# Patient Record
Sex: Female | Born: 1980
Health system: Southern US, Community
[De-identification: ages and names within clinical notes are randomized; demographics above are authoritative.]

## PROBLEM LIST (undated history)

## (undated) DIAGNOSIS — F909 Attention-deficit hyperactivity disorder, unspecified type: Secondary | ICD-10-CM

## (undated) DIAGNOSIS — F32A Depression, unspecified: Secondary | ICD-10-CM

## (undated) DIAGNOSIS — K219 Gastro-esophageal reflux disease without esophagitis: Secondary | ICD-10-CM

## (undated) DIAGNOSIS — D649 Anemia, unspecified: Secondary | ICD-10-CM

## (undated) DIAGNOSIS — E785 Hyperlipidemia, unspecified: Secondary | ICD-10-CM

## (undated) DIAGNOSIS — R0602 Shortness of breath: Secondary | ICD-10-CM

## (undated) DIAGNOSIS — I10 Essential (primary) hypertension: Secondary | ICD-10-CM

## (undated) DIAGNOSIS — M869 Osteomyelitis, unspecified: Secondary | ICD-10-CM

## (undated) DIAGNOSIS — A4902 Methicillin resistant Staphylococcus aureus infection, unspecified site: Secondary | ICD-10-CM

## (undated) DIAGNOSIS — E119 Type 2 diabetes mellitus without complications: Secondary | ICD-10-CM

## (undated) DIAGNOSIS — Z89422 Acquired absence of other left toe(s): Secondary | ICD-10-CM

## (undated) DIAGNOSIS — I82A12 Acute embolism and thrombosis of left axillary vein: Secondary | ICD-10-CM

## (undated) HISTORY — DX: Acute embolism and thrombosis of left axillary vein: I82.A12

## (undated) HISTORY — DX: Gastro-esophageal reflux disease without esophagitis: K21.9

## (undated) HISTORY — DX: Type 2 diabetes mellitus without complications: E11.9

## (undated) HISTORY — DX: Depression, unspecified: F32.A

## (undated) HISTORY — DX: Essential (primary) hypertension: I10

## (undated) HISTORY — DX: Hyperlipidemia, unspecified: E78.5

## (undated) HISTORY — DX: Methicillin resistant Staphylococcus aureus infection, unspecified site: A49.02

## (undated) HISTORY — DX: Anemia, unspecified: D64.9

## (undated) HISTORY — DX: Shortness of breath: R06.02

## (undated) HISTORY — DX: Acquired absence of other left toe(s): Z89.422

## (undated) HISTORY — DX: Osteomyelitis, unspecified: M86.9

## (undated) HISTORY — DX: Attention-deficit hyperactivity disorder, unspecified type: F90.9

## (undated) HISTORY — PX: AMPUTATION OF REPLICATED TOES: SHX1136

---

## 2017-01-30 DIAGNOSIS — I824Y2 Acute embolism and thrombosis of unspecified deep veins of left proximal lower extremity: Secondary | ICD-10-CM | POA: Insufficient documentation

## 2017-10-01 DIAGNOSIS — E559 Vitamin D deficiency, unspecified: Secondary | ICD-10-CM | POA: Insufficient documentation

## 2017-10-01 DIAGNOSIS — D509 Iron deficiency anemia, unspecified: Secondary | ICD-10-CM | POA: Insufficient documentation

## 2017-11-19 DIAGNOSIS — L0390 Cellulitis, unspecified: Secondary | ICD-10-CM | POA: Insufficient documentation

## 2018-11-19 DIAGNOSIS — K219 Gastro-esophageal reflux disease without esophagitis: Secondary | ICD-10-CM | POA: Diagnosis not present

## 2018-11-19 DIAGNOSIS — I10 Essential (primary) hypertension: Secondary | ICD-10-CM | POA: Diagnosis not present

## 2018-11-19 DIAGNOSIS — R7303 Prediabetes: Secondary | ICD-10-CM | POA: Diagnosis not present

## 2018-11-21 ENCOUNTER — Other Ambulatory Visit: Payer: Self-pay | Admitting: Nurse Practitioner

## 2018-11-21 DIAGNOSIS — R011 Cardiac murmur, unspecified: Secondary | ICD-10-CM

## 2018-11-25 DIAGNOSIS — D649 Anemia, unspecified: Secondary | ICD-10-CM | POA: Diagnosis not present

## 2018-11-29 ENCOUNTER — Other Ambulatory Visit: Payer: 59

## 2018-12-17 ENCOUNTER — Other Ambulatory Visit: Payer: Self-pay

## 2018-12-17 ENCOUNTER — Ambulatory Visit (INDEPENDENT_AMBULATORY_CARE_PROVIDER_SITE_OTHER): Payer: 59

## 2018-12-17 DIAGNOSIS — R011 Cardiac murmur, unspecified: Secondary | ICD-10-CM | POA: Diagnosis not present

## 2018-12-31 ENCOUNTER — Other Ambulatory Visit: Payer: 59

## 2019-05-23 ENCOUNTER — Telehealth: Payer: Self-pay | Admitting: Physician Assistant

## 2019-05-23 NOTE — Telephone Encounter (Signed)
Received a new hem referral from Dr. Quillian Quince for iron deficiency. Chloe Morrow has been cld and scheduled to see [name] on 9/28 at 1:30pm. She's been made aware to arrive 15 minutes early. Letter mailed.

## 2019-05-26 ENCOUNTER — Encounter: Payer: Self-pay | Admitting: Physician Assistant

## 2019-06-02 ENCOUNTER — Encounter: Payer: Self-pay | Admitting: Internal Medicine

## 2019-06-02 ENCOUNTER — Inpatient Hospital Stay: Payer: 59

## 2019-06-02 ENCOUNTER — Inpatient Hospital Stay: Payer: 59 | Attending: Internal Medicine | Admitting: Internal Medicine

## 2019-06-02 ENCOUNTER — Other Ambulatory Visit: Payer: Self-pay

## 2019-06-02 VITALS — BP 160/91 | HR 100 | Temp 98.7°F | Resp 16 | Ht 71.0 in | Wt 269.8 lb

## 2019-06-02 DIAGNOSIS — R0609 Other forms of dyspnea: Secondary | ICD-10-CM | POA: Diagnosis not present

## 2019-06-02 DIAGNOSIS — R069 Unspecified abnormalities of breathing: Secondary | ICD-10-CM

## 2019-06-02 DIAGNOSIS — R5383 Other fatigue: Secondary | ICD-10-CM | POA: Diagnosis not present

## 2019-06-02 DIAGNOSIS — E119 Type 2 diabetes mellitus without complications: Secondary | ICD-10-CM | POA: Insufficient documentation

## 2019-06-02 DIAGNOSIS — I10 Essential (primary) hypertension: Secondary | ICD-10-CM | POA: Insufficient documentation

## 2019-06-02 DIAGNOSIS — Z79899 Other long term (current) drug therapy: Secondary | ICD-10-CM | POA: Insufficient documentation

## 2019-06-02 DIAGNOSIS — R42 Dizziness and giddiness: Secondary | ICD-10-CM | POA: Insufficient documentation

## 2019-06-02 DIAGNOSIS — Z791 Long term (current) use of non-steroidal anti-inflammatories (NSAID): Secondary | ICD-10-CM | POA: Diagnosis not present

## 2019-06-02 DIAGNOSIS — D509 Iron deficiency anemia, unspecified: Secondary | ICD-10-CM | POA: Insufficient documentation

## 2019-06-02 LAB — CBC WITH DIFFERENTIAL (CANCER CENTER ONLY)
Abs Immature Granulocytes: 0.02 10*3/uL (ref 0.00–0.07)
Basophils Absolute: 0.1 10*3/uL (ref 0.0–0.1)
Basophils Relative: 1 %
Eosinophils Absolute: 0.1 10*3/uL (ref 0.0–0.5)
Eosinophils Relative: 1 %
HCT: 31.7 % — ABNORMAL LOW (ref 36.0–46.0)
Hemoglobin: 9.2 g/dL — ABNORMAL LOW (ref 12.0–15.0)
Immature Granulocytes: 0 %
Lymphocytes Relative: 30 %
Lymphs Abs: 1.8 10*3/uL (ref 0.7–4.0)
MCH: 20.7 pg — ABNORMAL LOW (ref 26.0–34.0)
MCHC: 29 g/dL — ABNORMAL LOW (ref 30.0–36.0)
MCV: 71.4 fL — ABNORMAL LOW (ref 80.0–100.0)
Monocytes Absolute: 0.4 10*3/uL (ref 0.1–1.0)
Monocytes Relative: 6 %
Neutro Abs: 3.7 10*3/uL (ref 1.7–7.7)
Neutrophils Relative %: 62 %
Platelet Count: 396 10*3/uL (ref 150–400)
RBC: 4.44 MIL/uL (ref 3.87–5.11)
RDW: 18.7 % — ABNORMAL HIGH (ref 11.5–15.5)
WBC Count: 6.1 10*3/uL (ref 4.0–10.5)
nRBC: 0 % (ref 0.0–0.2)

## 2019-06-02 LAB — CMP (CANCER CENTER ONLY)
ALT: 32 U/L (ref 0–44)
AST: 22 U/L (ref 15–41)
Albumin: 3.7 g/dL (ref 3.5–5.0)
Alkaline Phosphatase: 83 U/L (ref 38–126)
Anion gap: 11 (ref 5–15)
BUN: 11 mg/dL (ref 6–20)
CO2: 22 mmol/L (ref 22–32)
Calcium: 8.7 mg/dL — ABNORMAL LOW (ref 8.9–10.3)
Chloride: 105 mmol/L (ref 98–111)
Creatinine: 0.83 mg/dL (ref 0.44–1.00)
GFR, Est AFR Am: >60 mL/min (ref >60)
GFR, Estimated: >60 mL/min (ref >60)
Glucose, Bld: 159 mg/dL — ABNORMAL HIGH (ref 70–99)
Potassium: 3.6 mmol/L (ref 3.5–5.1)
Sodium: 138 mmol/L (ref 135–145)
Total Bilirubin: 0.3 mg/dL (ref 0.3–1.2)
Total Protein: 7.5 g/dL (ref 6.5–8.1)

## 2019-06-02 LAB — LACTATE DEHYDROGENASE: LDH: 142 U/L (ref 98–192)

## 2019-06-02 LAB — IRON AND TIBC
Iron: 28 ug/dL — ABNORMAL LOW (ref 41–142)
Saturation Ratios: 8 % — ABNORMAL LOW (ref 21–57)
TIBC: 369 ug/dL (ref 236–444)
UIBC: 341 ug/dL (ref 120–384)

## 2019-06-02 LAB — RETICULOCYTES
Immature Retic Fract: 20.8 % — ABNORMAL HIGH (ref 2.3–15.9)
RBC.: 4.41 MIL/uL (ref 3.87–5.11)
Retic Count, Absolute: 95.3 10*3/uL (ref 19.0–186.0)
Retic Ct Pct: 2.2 % (ref 0.4–3.1)

## 2019-06-02 LAB — FERRITIN: Ferritin: <4 ng/mL — ABNORMAL LOW (ref 11–307)
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
## 2019-06-03 ENCOUNTER — Telehealth: Payer: Self-pay | Admitting: Internal Medicine

## 2019-06-03 LAB — HEAVY METALS, BLOOD
Arsenic: 6 ug/L (ref 2–23)
Lead: <1 ug/dL (ref 0–4)
Mercury: <1 ug/L (ref 0.0–14.9)

## 2019-06-03 NOTE — Telephone Encounter (Signed)
Scheduled apt per 9/28 los - pt aware of appt date and time

## 2019-06-06 ENCOUNTER — Other Ambulatory Visit: Payer: Self-pay

## 2019-06-06 ENCOUNTER — Inpatient Hospital Stay: Payer: BC Managed Care – PPO | Attending: Internal Medicine

## 2019-06-06 VITALS — BP 147/86 | HR 78 | Temp 98.8°F | Resp 16

## 2019-06-06 DIAGNOSIS — D509 Iron deficiency anemia, unspecified: Secondary | ICD-10-CM | POA: Diagnosis not present

## 2019-06-06 MED ORDER — SODIUM CHLORIDE 0.9 % IV SOLN
Freq: Once | INTRAVENOUS | Status: AC
  Administered 2019-06-06: 09:00:00 via INTRAVENOUS
  Filled 2019-06-06: qty 250

## 2019-06-06 MED ORDER — ACETAMINOPHEN 325 MG PO TABS
ORAL_TABLET | ORAL | Status: AC
  Filled 2019-06-06: qty 2

## 2019-06-06 MED ORDER — ACETAMINOPHEN 325 MG PO TABS
650.0000 mg | ORAL_TABLET | Freq: Once | ORAL | Status: AC
  Administered 2019-06-06: 650 mg via ORAL

## 2019-06-06 MED ORDER — SODIUM CHLORIDE 0.9 % IV SOLN
200.0000 mg | Freq: Once | INTRAVENOUS | Status: AC
  Administered 2019-06-06: 200 mg via INTRAVENOUS
  Filled 2019-06-06: qty 10

## 2019-06-06 NOTE — Patient Instructions (Signed)

## 2019-06-06 NOTE — Progress Notes (Signed)
Patient started to complain of headache, 6/10, aching immediately after completion of Iron infusion. Patient denied having any other symptoms. Patient's VSS, but her temp did go up to 99.1. Sandi Mealy, PA notified and tylenol ordered. Will continue to monitor patient.  Patient's VSS and temp has decreasedupon completion of 30 minute observation period. Per Sandi Mealy, PA ok for patient to go home.  Patient advised to check temperatures at home and to take tylenol or ibuprofen as needed. Patient advised to seek medical attention if headache worsens or if she develops any other symptoms of a reaction. Educational handout given to patient.

## 2019-06-13 ENCOUNTER — Inpatient Hospital Stay: Payer: BC Managed Care – PPO

## 2019-06-13 ENCOUNTER — Other Ambulatory Visit: Payer: Self-pay

## 2019-06-13 ENCOUNTER — Ambulatory Visit (HOSPITAL_BASED_OUTPATIENT_CLINIC_OR_DEPARTMENT_OTHER): Payer: BC Managed Care – PPO | Admitting: Medical

## 2019-06-13 VITALS — BP 150/85 | HR 79 | Temp 97.4°F | Resp 16

## 2019-06-13 DIAGNOSIS — D509 Iron deficiency anemia, unspecified: Secondary | ICD-10-CM

## 2019-06-13 MED ORDER — ACETAMINOPHEN 325 MG PO TABS
ORAL_TABLET | ORAL | Status: AC
  Filled 2019-06-13: qty 2

## 2019-06-13 MED ORDER — SODIUM CHLORIDE 0.9 % IV SOLN
200.0000 mg | Freq: Once | INTRAVENOUS | Status: AC
  Administered 2019-06-13: 200 mg via INTRAVENOUS
  Filled 2019-06-13: qty 10

## 2019-06-13 MED ORDER — DIPHENHYDRAMINE HCL 25 MG PO CAPS
ORAL_CAPSULE | ORAL | Status: AC
  Filled 2019-06-13: qty 1

## 2019-06-13 MED ORDER — SODIUM CHLORIDE 0.9 % IV SOLN
Freq: Once | INTRAVENOUS | Status: AC
  Administered 2019-06-13: 09:00:00 via INTRAVENOUS
  Filled 2019-06-13: qty 250

## 2019-06-13 MED ORDER — ACETAMINOPHEN 325 MG PO TABS
650.0000 mg | ORAL_TABLET | Freq: Once | ORAL | Status: AC
  Administered 2019-06-13: 650 mg via ORAL

## 2019-06-13 MED ORDER — DIPHENHYDRAMINE HCL 50 MG/ML IJ SOLN: 25.0000 mg | Freq: Once | INTRAMUSCULAR | Status: DC

## 2019-06-13 MED ORDER — DIPHENHYDRAMINE HCL 25 MG PO CAPS
25.0000 mg | ORAL_CAPSULE | Freq: Once | ORAL | Status: AC
  Administered 2019-06-13: 25 mg via ORAL

## 2019-06-13 NOTE — Progress Notes (Signed)
During iron infusion, patient began c/o discomfort at IV site. Infusion immediately stopped. No obvious edema, erythema, or staining noted. Area tender to light palpation. No blood return noted. IV d/c'd, ice pack applied, and site elevated on a pillow. New site obtained and infusion resumed. Sandi Mealy, PA-C evaluated patient in treatment room.

## 2019-06-13 NOTE — Patient Instructions (Signed)

## 2019-06-20 ENCOUNTER — Other Ambulatory Visit: Payer: Self-pay

## 2019-06-20 ENCOUNTER — Inpatient Hospital Stay: Payer: BC Managed Care – PPO

## 2019-06-20 VITALS — BP 155/79 | HR 87 | Temp 99.0°F | Resp 16

## 2019-06-20 DIAGNOSIS — D509 Iron deficiency anemia, unspecified: Secondary | ICD-10-CM | POA: Diagnosis not present

## 2019-06-20 MED ORDER — DIPHENHYDRAMINE HCL 25 MG PO CAPS
ORAL_CAPSULE | ORAL | Status: AC
  Filled 2019-06-20: qty 1

## 2019-06-20 MED ORDER — ACETAMINOPHEN 325 MG PO TABS
650.0000 mg | ORAL_TABLET | Freq: Once | ORAL | Status: AC
  Administered 2019-06-20: 650 mg via ORAL

## 2019-06-20 MED ORDER — SODIUM CHLORIDE 0.9 % IV SOLN
200.0000 mg | Freq: Once | INTRAVENOUS | Status: AC
  Administered 2019-06-20: 09:00:00 200 mg via INTRAVENOUS
  Filled 2019-06-20: qty 10

## 2019-06-20 MED ORDER — ACETAMINOPHEN 325 MG PO TABS
ORAL_TABLET | ORAL | Status: AC
  Filled 2019-06-20: qty 2

## 2019-06-20 MED ORDER — SODIUM CHLORIDE 0.9 % IV SOLN
Freq: Once | INTRAVENOUS | Status: AC
  Administered 2019-06-20: 08:00:00 via INTRAVENOUS
  Filled 2019-06-20: qty 250

## 2019-06-20 MED ORDER — DIPHENHYDRAMINE HCL 25 MG PO CAPS
25.0000 mg | ORAL_CAPSULE | Freq: Once | ORAL | Status: AC
  Administered 2019-06-20: 09:00:00 25 mg via ORAL

## 2019-06-20 NOTE — Patient Instructions (Signed)

## 2019-06-20 NOTE — Progress Notes (Signed)
This patient was seen in infusion as she was receiving IV iron.  She had had possible extravasation of IV iron in her left dorsal finger.  She had no issues of concern.  There was no evidence of hyperpigmentation at the site of the infusion.  She was told to return as needed.  Sandi Mealy, MHS, PA-C Physician Assistant

## 2019-06-27 ENCOUNTER — Inpatient Hospital Stay: Payer: BC Managed Care – PPO

## 2019-06-27 ENCOUNTER — Other Ambulatory Visit: Payer: Self-pay

## 2019-06-27 VITALS — BP 152/92 | HR 88 | Temp 98.2°F | Resp 16

## 2019-06-27 DIAGNOSIS — D509 Iron deficiency anemia, unspecified: Secondary | ICD-10-CM | POA: Diagnosis not present

## 2019-06-27 MED ORDER — DIPHENHYDRAMINE HCL 25 MG PO CAPS
ORAL_CAPSULE | ORAL | Status: AC
  Filled 2019-06-27: qty 1

## 2019-06-27 MED ORDER — SODIUM CHLORIDE 0.9 % IV SOLN
200.0000 mg | Freq: Once | INTRAVENOUS | Status: AC
  Administered 2019-06-27: 09:00:00 200 mg via INTRAVENOUS
  Filled 2019-06-27: qty 10

## 2019-06-27 MED ORDER — DIPHENHYDRAMINE HCL 25 MG PO CAPS
25.0000 mg | ORAL_CAPSULE | Freq: Once | ORAL | Status: AC
  Administered 2019-06-27: 09:00:00 25 mg via ORAL

## 2019-06-27 MED ORDER — SODIUM CHLORIDE 0.9 % IV SOLN
Freq: Once | INTRAVENOUS | Status: AC
  Administered 2019-06-27: 08:00:00 via INTRAVENOUS
  Filled 2019-06-27: qty 250

## 2019-06-27 MED ORDER — ACETAMINOPHEN 325 MG PO TABS
650.0000 mg | ORAL_TABLET | Freq: Once | ORAL | Status: AC
  Administered 2019-06-27: 650 mg via ORAL

## 2019-06-27 MED ORDER — ACETAMINOPHEN 325 MG PO TABS
ORAL_TABLET | ORAL | Status: AC
  Filled 2019-06-27: qty 2

## 2019-06-27 NOTE — Progress Notes (Signed)
Patient c/o intermittent mid-abdominal discomfort. Denies any precipitating factors or other symptoms. Dr. Julien Nordmann notified. Dr. Julien Nordmann advised he feels this complaint likely needs additional work-up with PCP and does not feel this is due to current iron therapy. Patient aware and will f/u with PCP.

## 2019-06-27 NOTE — Patient Instructions (Signed)

## 2019-07-02 DIAGNOSIS — R1013 Epigastric pain: Secondary | ICD-10-CM | POA: Diagnosis not present

## 2019-07-04 DIAGNOSIS — D509 Iron deficiency anemia, unspecified: Secondary | ICD-10-CM | POA: Diagnosis not present

## 2019-07-04 DIAGNOSIS — R140 Abdominal distension (gaseous): Secondary | ICD-10-CM | POA: Diagnosis not present

## 2019-07-04 DIAGNOSIS — R1010 Upper abdominal pain, unspecified: Secondary | ICD-10-CM | POA: Diagnosis not present

## 2019-08-04 ENCOUNTER — Other Ambulatory Visit: Payer: Self-pay

## 2019-08-04 ENCOUNTER — Telehealth: Payer: Self-pay | Admitting: Internal Medicine

## 2019-08-04 ENCOUNTER — Encounter: Payer: Self-pay | Admitting: Internal Medicine

## 2019-08-04 ENCOUNTER — Inpatient Hospital Stay: Payer: BC Managed Care – PPO | Attending: Internal Medicine | Admitting: Internal Medicine

## 2019-08-04 ENCOUNTER — Inpatient Hospital Stay: Payer: BC Managed Care – PPO

## 2019-08-04 VITALS — BP 150/94 | HR 122 | Temp 99.0°F | Resp 20 | Ht 71.0 in | Wt 269.4 lb

## 2019-08-04 DIAGNOSIS — D509 Iron deficiency anemia, unspecified: Secondary | ICD-10-CM

## 2019-08-04 DIAGNOSIS — D500 Iron deficiency anemia secondary to blood loss (chronic): Secondary | ICD-10-CM | POA: Diagnosis not present

## 2019-08-04 DIAGNOSIS — I10 Essential (primary) hypertension: Secondary | ICD-10-CM | POA: Diagnosis not present

## 2019-08-04 DIAGNOSIS — N920 Excessive and frequent menstruation with regular cycle: Secondary | ICD-10-CM | POA: Insufficient documentation

## 2019-08-04 DIAGNOSIS — E119 Type 2 diabetes mellitus without complications: Secondary | ICD-10-CM | POA: Insufficient documentation

## 2019-08-04 DIAGNOSIS — Z791 Long term (current) use of non-steroidal anti-inflammatories (NSAID): Secondary | ICD-10-CM | POA: Insufficient documentation

## 2019-08-04 DIAGNOSIS — Z79899 Other long term (current) drug therapy: Secondary | ICD-10-CM | POA: Insufficient documentation

## 2019-08-04 LAB — CBC WITH DIFFERENTIAL (CANCER CENTER ONLY)
Abs Immature Granulocytes: 0.03 10*3/uL (ref 0.00–0.07)
Basophils Absolute: 0.1 10*3/uL (ref 0.0–0.1)
Basophils Relative: 1 %
Eosinophils Absolute: 0.1 10*3/uL (ref 0.0–0.5)
Eosinophils Relative: 1 %
HCT: 41.1 % (ref 36.0–46.0)
Hemoglobin: 12.9 g/dL (ref 12.0–15.0)
Immature Granulocytes: 0 %
Lymphocytes Relative: 25 %
Lymphs Abs: 1.8 10*3/uL (ref 0.7–4.0)
MCH: 24.5 pg — ABNORMAL LOW (ref 26.0–34.0)
MCHC: 31.4 g/dL (ref 30.0–36.0)
MCV: 78 fL — ABNORMAL LOW (ref 80.0–100.0)
Monocytes Absolute: 0.5 10*3/uL (ref 0.1–1.0)
Monocytes Relative: 7 %
Neutro Abs: 4.6 10*3/uL (ref 1.7–7.7)
Neutrophils Relative %: 66 %
Platelet Count: 360 10*3/uL (ref 150–400)
RBC: 5.27 MIL/uL — ABNORMAL HIGH (ref 3.87–5.11)
RDW: 18.8 % — ABNORMAL HIGH (ref 11.5–15.5)
WBC Count: 7 10*3/uL (ref 4.0–10.5)
nRBC: 0 % (ref 0.0–0.2)

## 2019-08-04 LAB — IRON AND TIBC
Iron: 55 ug/dL (ref 41–142)
Saturation Ratios: 16 % — ABNORMAL LOW (ref 21–57)
TIBC: 344 ug/dL (ref 236–444)
UIBC: 288 ug/dL (ref 120–384)

## 2019-08-04 LAB — FERRITIN: Ferritin: 17 ng/mL (ref 11–307)

## 2019-08-04 NOTE — Telephone Encounter (Signed)
Scheduled per los. Called and left msg. Mailed printout  °

## 2019-08-04 NOTE — Progress Notes (Signed)
[facility] [phone]   [phone]  OFFICE PROGRESS NOTE  Carolee Rota, NP [place] [address]  DIAGNOSIS: persistent iron deficiency anemia of unclear etiology except for the potential blood loss with menstruation and dietary deficiency.  PRIOR THERAPY: Iron infusion with Venofer weekly x4 weeks.  CURRENT THERAPY: None  INTERVAL HISTORY: Chloe Morrow 38 y.o. female returns to the clinic today for follow-up visit.  The patient is feeling fine today with no concerning complaints except for intermittent abdominal pain with bloating.  She is scheduled to have upper endoscopy by her gastroenterologist on August 25, 2019.  She denied having any chest pain, shortness of breath, cough or hemoptysis.  She denied having any fever or chills.  She has intermittent nausea with no vomiting, but has few episodes of diarrhea with no constipation.  She is here today for evaluation and repeat CBC and iron study.  MEDICAL HISTORY: Past Medical History:  Diagnosis Date  . Diabetes mellitus without complication ([facility])   . History of complete ray amputation of fifth toe of left foot ([facility])   . Hypertension   . Osteomyelitis (HCC)     ALLERGIES:  is allergic to penicillins and sulfa antibiotics.  MEDICATIONS:  Current Outpatient Medications  Medication Sig Dispense Refill  . amLODipine (NORVASC) 5 MG tablet TAKE 1 TABLET(5 MG) BY MOUTH DAILY    . diclofenac (VOLTAREN) 75 MG EC tablet Take 75 mg by mouth 2 (two) times daily.    Marland Kitchen glyBURIDE-metformin (GLUCOVANCE) 5-500 MG tablet Take by mouth.    Marland Kitchen lisinopril-hydrochlorothiazide (ZESTORETIC) 20-12.5 MG tablet Take 1 tablet by mouth 2 (two) times daily.     Marland Kitchen omeprazole (PRILOSEC) 20 MG capsule TAKE 1 CAPSULE(20 MG) BY MOUTH DAILY    . traZODone (DESYREL) 50 MG tablet Take 50 mg by mouth at bedtime.     No current facility-administered medications for this visit.     SURGICAL HISTORY: No past  surgical history on file.  REVIEW OF SYSTEMS:  A comprehensive review of systems was negative except for: Constitutional: positive for fatigue Gastrointestinal: positive for abdominal pain, diarrhea and nausea   PHYSICAL EXAMINATION: General appearance: alert, cooperative, fatigued and no distress Head: Normocephalic, without obvious abnormality, atraumatic Neck: no adenopathy, no JVD, supple, symmetrical, trachea midline and thyroid not enlarged, symmetric, no tenderness/mass/nodules Lymph nodes: Cervical, supraclavicular, and axillary nodes normal. Resp: clear to auscultation bilaterally Back: symmetric, no curvature. ROM normal. No CVA tenderness. Cardio: regular rate and rhythm, S1, S2 normal, no murmur, click, rub or gallop GI: soft, non-tender; bowel sounds normal; no masses,  no organomegaly Extremities: extremities normal, atraumatic, no cyanosis or edema  ECOG PERFORMANCE STATUS: 1 - Symptomatic but completely ambulatory  Blood pressure (!) 150/94, pulse (!) 122, temperature 99 F (37.2 C), temperature source Oral, resp. rate 20, height 5\' 11"  (1.803 m), weight 269 lb 6.4 oz (122.2 kg), SpO2 99 %.  LABORATORY DATA: Lab Results  Component Value Date   WBC 7.0 08/04/2019   HGB 12.9 08/04/2019   HCT 41.1 08/04/2019   MCV 78.0 (L) 08/04/2019   PLT 360 08/04/2019      Chemistry      Component Value Date/Time   NA 138 06/02/2019 1441   K 3.6 06/02/2019 1441   CL 105 06/02/2019 1441   CO2 22 06/02/2019 1441   BUN 11 06/02/2019 1441   CREATININE 0.83 06/02/2019 1441      Component Value Date/Time  CALCIUM 8.7 (L) 06/02/2019 1441   ALKPHOS 83 06/02/2019 1441   AST 22 06/02/2019 1441   ALT 32 06/02/2019 1441   BILITOT 0.3 06/02/2019 1441       RADIOGRAPHIC STUDIES: No results found.  ASSESSMENT AND PLAN: This is a very pleasant 38 years old white female with persistent iron deficiency anemia secondary to blood loss from menorrhagia and dietary deficiency.  The  patient is status post iron infusion with Venofer weekly for 4 weeks. She had repeat CBC, iron study and ferritin today that showed significant improvement in her anemia as well as the iron study and ferritin. I recommended for the patient to continue on observation with repeat CBC, iron study and ferritin in 2 months. For the nausea, diarrhea and abdominal pain, she is scheduled to see gastroenterology for evaluation and consideration of upper endoscopy.  The patient was also advised about adjusting her diet and taking Gas-X if needed. She was advised to call immediately if she has any concerning symptoms in the interval. The patient voices understanding of current disease status and treatment options and is in agreement with the current care plan.  All questions were answered. The patient knows to call the clinic with any problems, questions or concerns. We can certainly see the patient much sooner if necessary.  I spent 10 minutes counseling the patient face to face. The total time spent in the appointment was 15 minutes.  Disclaimer: This note was dictated with voice recognition software. Similar sounding words can inadvertently be transcribed and may not be corrected upon review.

## 2019-08-05 DIAGNOSIS — Z20828 Contact with and (suspected) exposure to other viral communicable diseases: Secondary | ICD-10-CM | POA: Diagnosis not present

## 2019-08-20 DIAGNOSIS — Z1159 Encounter for screening for other viral diseases: Secondary | ICD-10-CM | POA: Diagnosis not present

## 2019-08-25 DIAGNOSIS — R1010 Upper abdominal pain, unspecified: Secondary | ICD-10-CM | POA: Diagnosis not present

## 2019-08-25 DIAGNOSIS — R140 Abdominal distension (gaseous): Secondary | ICD-10-CM | POA: Diagnosis not present

## 2019-08-25 DIAGNOSIS — D509 Iron deficiency anemia, unspecified: Secondary | ICD-10-CM | POA: Diagnosis not present

## 2019-08-25 DIAGNOSIS — K3189 Other diseases of stomach and duodenum: Secondary | ICD-10-CM | POA: Diagnosis not present

## 2019-10-06 ENCOUNTER — Other Ambulatory Visit: Payer: Self-pay | Admitting: Medical Oncology

## 2019-10-06 ENCOUNTER — Encounter: Payer: Self-pay | Admitting: Internal Medicine

## 2019-10-06 ENCOUNTER — Other Ambulatory Visit: Payer: Self-pay | Admitting: Internal Medicine

## 2019-10-06 ENCOUNTER — Inpatient Hospital Stay: Payer: BC Managed Care – PPO

## 2019-10-06 ENCOUNTER — Other Ambulatory Visit: Payer: Self-pay

## 2019-10-06 ENCOUNTER — Inpatient Hospital Stay: Payer: BC Managed Care – PPO | Attending: Internal Medicine | Admitting: Internal Medicine

## 2019-10-06 ENCOUNTER — Telehealth: Payer: Self-pay | Admitting: Medical Oncology

## 2019-10-06 VITALS — BP 160/90 | HR 100 | Temp 98.0°F | Resp 20 | Ht 71.0 in | Wt 267.0 lb

## 2019-10-06 DIAGNOSIS — Z79899 Other long term (current) drug therapy: Secondary | ICD-10-CM | POA: Diagnosis not present

## 2019-10-06 DIAGNOSIS — E639 Nutritional deficiency, unspecified: Secondary | ICD-10-CM | POA: Diagnosis not present

## 2019-10-06 DIAGNOSIS — E119 Type 2 diabetes mellitus without complications: Secondary | ICD-10-CM | POA: Diagnosis not present

## 2019-10-06 DIAGNOSIS — D509 Iron deficiency anemia, unspecified: Secondary | ICD-10-CM

## 2019-10-06 DIAGNOSIS — Z791 Long term (current) use of non-steroidal anti-inflammatories (NSAID): Secondary | ICD-10-CM | POA: Insufficient documentation

## 2019-10-06 DIAGNOSIS — D500 Iron deficiency anemia secondary to blood loss (chronic): Secondary | ICD-10-CM | POA: Insufficient documentation

## 2019-10-06 DIAGNOSIS — N920 Excessive and frequent menstruation with regular cycle: Secondary | ICD-10-CM | POA: Insufficient documentation

## 2019-10-06 DIAGNOSIS — I10 Essential (primary) hypertension: Secondary | ICD-10-CM

## 2019-10-06 LAB — IRON AND TIBC
Iron: 32 ug/dL — ABNORMAL LOW (ref 41–142)
Saturation Ratios: 9 % — ABNORMAL LOW (ref 21–57)
TIBC: 361 ug/dL (ref 236–444)
UIBC: 329 ug/dL (ref 120–384)

## 2019-10-06 LAB — CBC WITH DIFFERENTIAL (CANCER CENTER ONLY)
Abs Immature Granulocytes: 0.06 10*3/uL (ref 0.00–0.07)
Basophils Absolute: 0.1 10*3/uL (ref 0.0–0.1)
Basophils Relative: 1 %
Eosinophils Absolute: 0.1 10*3/uL (ref 0.0–0.5)
Eosinophils Relative: 1 %
HCT: 41.5 % (ref 36.0–46.0)
Hemoglobin: 13.1 g/dL (ref 12.0–15.0)
Immature Granulocytes: 1 %
Lymphocytes Relative: 22 %
Lymphs Abs: 1.9 10*3/uL (ref 0.7–4.0)
MCH: 25.7 pg — ABNORMAL LOW (ref 26.0–34.0)
MCHC: 31.6 g/dL (ref 30.0–36.0)
MCV: 81.5 fL (ref 80.0–100.0)
Monocytes Absolute: 0.5 10*3/uL (ref 0.1–1.0)
Monocytes Relative: 6 %
Neutro Abs: 5.9 10*3/uL (ref 1.7–7.7)
Neutrophils Relative %: 69 %
Platelet Count: 393 10*3/uL (ref 150–400)
RBC: 5.09 MIL/uL (ref 3.87–5.11)
RDW: 15.6 % — ABNORMAL HIGH (ref 11.5–15.5)
WBC Count: 8.6 10*3/uL (ref 4.0–10.5)
nRBC: 0 % (ref 0.0–0.2)

## 2019-10-06 LAB — FERRITIN: Ferritin: 6 ng/mL — ABNORMAL LOW (ref 11–307)

## 2019-10-06 MED ORDER — CLONIDINE HCL 0.1 MG PO TABS
0.1000 mg | ORAL_TABLET | ORAL | Status: AC
  Administered 2019-10-06: 0.1 mg via ORAL

## 2019-10-06 MED ORDER — CLONIDINE HCL 0.1 MG PO TABS: 0.1000 mg | ORAL_TABLET | Freq: Once | ORAL | Status: DC

## 2019-10-06 MED ORDER — CLONIDINE HCL 0.1 MG PO TABS
ORAL_TABLET | ORAL | Status: AC
  Filled 2019-10-06: qty 1

## 2019-10-06 NOTE — Telephone Encounter (Signed)
-----   Message from Si Gaul, MD sent at 10/06/2019 12:40 PM EST ----- She will need iron infusion again.  I will order to start this Friday and for the next 4 weeks. ----- Message ----- From: Leory Plowman, Lab In [place] Sent: 10/06/2019   9:15 AM EST To: Si Gaul, MD

## 2019-10-06 NOTE — Telephone Encounter (Signed)
Pt notified. Schedule message sent for 4 doses of ferrlicet

## 2019-10-06 NOTE — Addendum Note (Signed)
Addended by: Charma Igo on: 10/06/2019 10:05 AM   Modules accepted: Orders

## 2019-10-06 NOTE — Progress Notes (Signed)
Dr Arbutus Ped ordered clonidine 0.1 mg today for Hypertension.

## 2019-10-06 NOTE — Progress Notes (Signed)
[facility] [phone]   [phone]  OFFICE PROGRESS NOTE  Carolee Rota, NP [place] [address]  DIAGNOSIS: persistent iron deficiency anemia of unclear etiology except for the potential blood loss with menstruation and dietary deficiency.  PRIOR THERAPY: Iron infusion with Venofer weekly x4 weeks.  CURRENT THERAPY: None  INTERVAL HISTORY: Chloe Morrow 39 y.o. female returns to the clinic today for follow-up visit.  The patient is feeling fine today with no concerning complaints but she is very anxious about her visit.  She has a history of hypertension that was diagnosed at age 64 and she has extensive evaluation at that time.  She is currently on blood pressure medication by her primary care physician.  The patient denied having any current chest pain, shortness of breath, cough or hemoptysis.  She denied having any fever or chills.  She has no nausea, vomiting, diarrhea or constipation.  She is here today for evaluation and repeat CBC, iron study and ferritin.  MEDICAL HISTORY: Past Medical History:  Diagnosis Date  . Diabetes mellitus without complication ([facility])   . History of complete ray amputation of fifth toe of left foot ([facility])   . Hypertension   . Osteomyelitis (HCC)     ALLERGIES:  is allergic to penicillins and sulfa antibiotics.  MEDICATIONS:  Current Outpatient Medications  Medication Sig Dispense Refill  . amLODipine (NORVASC) 5 MG tablet TAKE 1 TABLET(5 MG) BY MOUTH DAILY    . diclofenac (VOLTAREN) 75 MG EC tablet Take 75 mg by mouth 2 (two) times daily.    Marland Kitchen dicyclomine (BENTYL) 20 MG tablet Take 20 mg by mouth 4 (four) times daily.    Marland Kitchen glyBURIDE-metformin (GLUCOVANCE) 5-500 MG tablet Take by mouth.    Marland Kitchen lisinopril-hydrochlorothiazide (ZESTORETIC) 20-12.5 MG tablet Take 1 tablet by mouth 2 (two) times daily.     Marland Kitchen omeprazole (PRILOSEC) 20 MG capsule TAKE 1 CAPSULE(20 MG) BY MOUTH DAILY    . ondansetron  (ZOFRAN) 4 MG tablet Take 4 mg by mouth every 6 (six) hours as needed.    . pantoprazole (PROTONIX) 40 MG tablet Take 40 mg by mouth daily.    . traZODone (DESYREL) 50 MG tablet Take 50 mg by mouth at bedtime.     No current facility-administered medications for this visit.    SURGICAL HISTORY: No past surgical history on file.  REVIEW OF SYSTEMS:  A comprehensive review of systems was negative.   PHYSICAL EXAMINATION: General appearance: alert, cooperative and no distress Head: Normocephalic, without obvious abnormality, atraumatic Neck: no adenopathy, no JVD, supple, symmetrical, trachea midline and thyroid not enlarged, symmetric, no tenderness/mass/nodules Lymph nodes: Cervical, supraclavicular, and axillary nodes normal. Resp: clear to auscultation bilaterally Back: symmetric, no curvature. ROM normal. No CVA tenderness. Cardio: regular rate and rhythm, S1, S2 normal, no murmur, click, rub or gallop GI: soft, non-tender; bowel sounds normal; no masses,  no organomegaly Extremities: extremities normal, atraumatic, no cyanosis or edema  ECOG PERFORMANCE STATUS: 1 - Symptomatic but completely ambulatory  Blood pressure (!) 165/79, pulse 100, temperature 98 F (36.7 C), temperature source Temporal, resp. rate 20, height 5\' 11"  (1.803 m), weight 267 lb (121.1 kg), SpO2 100 %.  LABORATORY DATA: Lab Results  Component Value Date   WBC 8.6 10/06/2019   HGB 13.1 10/06/2019   HCT 41.5 10/06/2019   MCV 81.5 10/06/2019   PLT 393 10/06/2019      Chemistry      Component  Value Date/Time   NA 138 06/02/2019 1441   K 3.6 06/02/2019 1441   CL 105 06/02/2019 1441   CO2 22 06/02/2019 1441   BUN 11 06/02/2019 1441   CREATININE 0.83 06/02/2019 1441      Component Value Date/Time   CALCIUM 8.7 (L) 06/02/2019 1441   ALKPHOS 83 06/02/2019 1441   AST 22 06/02/2019 1441   ALT 32 06/02/2019 1441   BILITOT 0.3 06/02/2019 1441       RADIOGRAPHIC STUDIES: No results found.   ASSESSMENT AND PLAN: This is a very pleasant 39 years old white female with persistent iron deficiency anemia secondary to blood loss from menorrhagia and dietary deficiency.  The patient is status post iron infusion with Venofer weekly for 4 weeks. The patient is currently on observation and she is feeling fine. Repeat CBC today showed normal hemoglobin and hematocrit.  Iron study and ferritin are still pending. For hypertension, I will give the patient a dose of clonidine 0.1 mg p.o. x1 today.  She was advised to take her blood pressure medication as prescribed and to monitor it closely at home and also to discuss with her primary care physician adjustment of her medication if needed. I will see the patient back for follow-up visit in 3 months for evaluation with repeat CBC, iron study and ferritin. If the iron studies showed severe iron deficiency, I will arrange for the patient to receive iron infusion. She was advised to call immediately if she has any concerning symptoms in the interval. The patient voices understanding of current disease status and treatment options and is in agreement with the current care plan.  All questions were answered. The patient knows to call the clinic with any problems, questions or concerns. We can certainly see the patient much sooner if necessary.  I spent 10 minutes counseling the patient face to face. The total time spent in the appointment was 15 minutes.  Disclaimer: This note was dictated with voice recognition software. Similar sounding words can inadvertently be transcribed and may not be corrected upon review.

## 2019-10-07 ENCOUNTER — Telehealth: Payer: Self-pay | Admitting: Internal Medicine

## 2019-10-07 NOTE — Telephone Encounter (Signed)
Scheduled appt per 2/1 sch message - pt aware of appt date and time   

## 2019-10-08 ENCOUNTER — Telehealth: Payer: Self-pay | Admitting: Internal Medicine

## 2019-10-08 NOTE — Telephone Encounter (Signed)
Scheduled per los. Called and left msg. Mailed printoutt

## 2019-10-10 ENCOUNTER — Other Ambulatory Visit: Payer: Self-pay

## 2019-10-10 ENCOUNTER — Inpatient Hospital Stay: Payer: BC Managed Care – PPO

## 2019-10-10 VITALS — BP 151/96 | HR 84 | Temp 97.8°F | Resp 18

## 2019-10-10 DIAGNOSIS — Z79899 Other long term (current) drug therapy: Secondary | ICD-10-CM | POA: Diagnosis not present

## 2019-10-10 DIAGNOSIS — E119 Type 2 diabetes mellitus without complications: Secondary | ICD-10-CM | POA: Diagnosis not present

## 2019-10-10 DIAGNOSIS — I10 Essential (primary) hypertension: Secondary | ICD-10-CM | POA: Diagnosis not present

## 2019-10-10 DIAGNOSIS — D500 Iron deficiency anemia secondary to blood loss (chronic): Secondary | ICD-10-CM | POA: Diagnosis not present

## 2019-10-10 DIAGNOSIS — D509 Iron deficiency anemia, unspecified: Secondary | ICD-10-CM

## 2019-10-10 DIAGNOSIS — Z791 Long term (current) use of non-steroidal anti-inflammatories (NSAID): Secondary | ICD-10-CM | POA: Diagnosis not present

## 2019-10-10 DIAGNOSIS — E639 Nutritional deficiency, unspecified: Secondary | ICD-10-CM | POA: Diagnosis not present

## 2019-10-10 DIAGNOSIS — N920 Excessive and frequent menstruation with regular cycle: Secondary | ICD-10-CM | POA: Diagnosis not present

## 2019-10-10 MED ORDER — DIPHENHYDRAMINE HCL 50 MG/ML IJ SOLN
INTRAMUSCULAR | Status: AC
  Filled 2019-10-10: qty 1

## 2019-10-10 MED ORDER — METHYLPREDNISOLONE SODIUM SUCC 125 MG IJ SOLR
INTRAMUSCULAR | Status: AC
  Filled 2019-10-10: qty 2

## 2019-10-10 MED ORDER — SODIUM CHLORIDE 0.9 % IV SOLN
125.0000 mg | Freq: Once | INTRAVENOUS | Status: AC
  Administered 2019-10-10: 09:00:00 125 mg via INTRAVENOUS
  Filled 2019-10-10: qty 10

## 2019-10-10 MED ORDER — SODIUM CHLORIDE 0.9 % IV SOLN
Freq: Once | INTRAVENOUS | Status: AC
  Filled 2019-10-10: qty 250

## 2019-10-10 MED ORDER — FAMOTIDINE IN NACL 20-0.9 MG/50ML-% IV SOLN
INTRAVENOUS | Status: AC
  Filled 2019-10-10: qty 50

## 2019-10-10 MED ORDER — DIPHENHYDRAMINE HCL 50 MG/ML IJ SOLN
50.0000 mg | Freq: Once | INTRAMUSCULAR | Status: AC
  Administered 2019-10-10: 50 mg via INTRAVENOUS

## 2019-10-10 MED ORDER — FAMOTIDINE IN NACL 20-0.9 MG/50ML-% IV SOLN
20.0000 mg | Freq: Once | INTRAVENOUS | Status: AC
  Administered 2019-10-10: 08:00:00 20 mg via INTRAVENOUS

## 2019-10-10 MED ORDER — ACETAMINOPHEN 325 MG PO TABS
650.0000 mg | ORAL_TABLET | Freq: Once | ORAL | Status: AC
  Administered 2019-10-10: 08:00:00 650 mg via ORAL

## 2019-10-10 MED ORDER — ACETAMINOPHEN 325 MG PO TABS
ORAL_TABLET | ORAL | Status: AC
  Filled 2019-10-10: qty 2

## 2019-10-10 MED ORDER — METHYLPREDNISOLONE SODIUM SUCC 125 MG IJ SOLR
125.0000 mg | Freq: Once | INTRAMUSCULAR | Status: AC
  Administered 2019-10-10: 08:00:00 125 mg via INTRAVENOUS

## 2019-10-10 NOTE — Patient Instructions (Signed)

## 2019-10-17 ENCOUNTER — Other Ambulatory Visit: Payer: Self-pay

## 2019-10-17 ENCOUNTER — Inpatient Hospital Stay: Payer: BC Managed Care – PPO

## 2019-10-17 VITALS — BP 171/85 | HR 98 | Temp 97.9°F | Resp 16

## 2019-10-17 DIAGNOSIS — E119 Type 2 diabetes mellitus without complications: Secondary | ICD-10-CM | POA: Diagnosis not present

## 2019-10-17 DIAGNOSIS — Z791 Long term (current) use of non-steroidal anti-inflammatories (NSAID): Secondary | ICD-10-CM | POA: Diagnosis not present

## 2019-10-17 DIAGNOSIS — I10 Essential (primary) hypertension: Secondary | ICD-10-CM | POA: Diagnosis not present

## 2019-10-17 DIAGNOSIS — E639 Nutritional deficiency, unspecified: Secondary | ICD-10-CM | POA: Diagnosis not present

## 2019-10-17 DIAGNOSIS — Z79899 Other long term (current) drug therapy: Secondary | ICD-10-CM | POA: Diagnosis not present

## 2019-10-17 DIAGNOSIS — D500 Iron deficiency anemia secondary to blood loss (chronic): Secondary | ICD-10-CM | POA: Diagnosis not present

## 2019-10-17 DIAGNOSIS — N920 Excessive and frequent menstruation with regular cycle: Secondary | ICD-10-CM | POA: Diagnosis not present

## 2019-10-17 DIAGNOSIS — D509 Iron deficiency anemia, unspecified: Secondary | ICD-10-CM

## 2019-10-17 MED ORDER — FAMOTIDINE IN NACL 20-0.9 MG/50ML-% IV SOLN
20.0000 mg | Freq: Once | INTRAVENOUS | Status: AC
  Administered 2019-10-17: 20 mg via INTRAVENOUS

## 2019-10-17 MED ORDER — SODIUM CHLORIDE 0.9 % IV SOLN
125.0000 mg | Freq: Once | INTRAVENOUS | Status: AC
  Administered 2019-10-17: 125 mg via INTRAVENOUS
  Filled 2019-10-17: qty 10

## 2019-10-17 MED ORDER — METHYLPREDNISOLONE SODIUM SUCC 125 MG IJ SOLR
INTRAMUSCULAR | Status: AC
  Filled 2019-10-17: qty 2

## 2019-10-17 MED ORDER — SODIUM CHLORIDE 0.9 % IV SOLN
Freq: Once | INTRAVENOUS | Status: AC
  Filled 2019-10-17: qty 250

## 2019-10-17 MED ORDER — METHYLPREDNISOLONE SODIUM SUCC 125 MG IJ SOLR
125.0000 mg | Freq: Once | INTRAMUSCULAR | Status: AC
  Administered 2019-10-17: 125 mg via INTRAVENOUS

## 2019-10-17 MED ORDER — FAMOTIDINE IN NACL 20-0.9 MG/50ML-% IV SOLN
INTRAVENOUS | Status: AC
  Filled 2019-10-17: qty 50

## 2019-10-17 MED ORDER — DIPHENHYDRAMINE HCL 50 MG/ML IJ SOLN
50.0000 mg | Freq: Once | INTRAMUSCULAR | Status: AC
  Administered 2019-10-17: 50 mg via INTRAVENOUS

## 2019-10-17 MED ORDER — ACETAMINOPHEN 325 MG PO TABS
ORAL_TABLET | ORAL | Status: AC
  Filled 2019-10-17: qty 2

## 2019-10-17 MED ORDER — ACETAMINOPHEN 325 MG PO TABS
650.0000 mg | ORAL_TABLET | Freq: Once | ORAL | Status: AC
  Administered 2019-10-17: 650 mg via ORAL

## 2019-10-17 MED ORDER — DIPHENHYDRAMINE HCL 50 MG/ML IJ SOLN
INTRAMUSCULAR | Status: AC
  Filled 2019-10-17: qty 1

## 2019-10-17 NOTE — Patient Instructions (Signed)

## 2019-10-24 ENCOUNTER — Other Ambulatory Visit: Payer: Self-pay

## 2019-10-24 ENCOUNTER — Inpatient Hospital Stay: Payer: BC Managed Care – PPO

## 2019-10-24 VITALS — BP 153/85 | HR 80 | Temp 98.7°F | Resp 17

## 2019-10-24 DIAGNOSIS — Z79899 Other long term (current) drug therapy: Secondary | ICD-10-CM | POA: Diagnosis not present

## 2019-10-24 DIAGNOSIS — N920 Excessive and frequent menstruation with regular cycle: Secondary | ICD-10-CM | POA: Diagnosis not present

## 2019-10-24 DIAGNOSIS — E639 Nutritional deficiency, unspecified: Secondary | ICD-10-CM | POA: Diagnosis not present

## 2019-10-24 DIAGNOSIS — I10 Essential (primary) hypertension: Secondary | ICD-10-CM | POA: Diagnosis not present

## 2019-10-24 DIAGNOSIS — E119 Type 2 diabetes mellitus without complications: Secondary | ICD-10-CM | POA: Diagnosis not present

## 2019-10-24 DIAGNOSIS — D509 Iron deficiency anemia, unspecified: Secondary | ICD-10-CM

## 2019-10-24 DIAGNOSIS — D500 Iron deficiency anemia secondary to blood loss (chronic): Secondary | ICD-10-CM | POA: Diagnosis not present

## 2019-10-24 DIAGNOSIS — Z791 Long term (current) use of non-steroidal anti-inflammatories (NSAID): Secondary | ICD-10-CM | POA: Diagnosis not present

## 2019-10-24 MED ORDER — FAMOTIDINE IN NACL 20-0.9 MG/50ML-% IV SOLN
INTRAVENOUS | Status: AC
  Filled 2019-10-24: qty 50

## 2019-10-24 MED ORDER — SODIUM CHLORIDE 0.9 % IV SOLN
Freq: Once | INTRAVENOUS | Status: AC
  Filled 2019-10-24: qty 250

## 2019-10-24 MED ORDER — DIPHENHYDRAMINE HCL 50 MG/ML IJ SOLN
INTRAMUSCULAR | Status: AC
  Filled 2019-10-24: qty 1

## 2019-10-24 MED ORDER — METHYLPREDNISOLONE SODIUM SUCC 125 MG IJ SOLR
INTRAMUSCULAR | Status: AC
  Filled 2019-10-24: qty 2

## 2019-10-24 MED ORDER — SODIUM CHLORIDE 0.9 % IV SOLN
125.0000 mg | Freq: Once | INTRAVENOUS | Status: AC
  Administered 2019-10-24: 125 mg via INTRAVENOUS
  Filled 2019-10-24: qty 10

## 2019-10-24 MED ORDER — ACETAMINOPHEN 325 MG PO TABS
ORAL_TABLET | ORAL | Status: AC
  Filled 2019-10-24: qty 2

## 2019-10-24 MED ORDER — DIPHENHYDRAMINE HCL 50 MG/ML IJ SOLN
50.0000 mg | Freq: Once | INTRAMUSCULAR | Status: AC
  Administered 2019-10-24: 50 mg via INTRAVENOUS

## 2019-10-24 MED ORDER — ACETAMINOPHEN 325 MG PO TABS
650.0000 mg | ORAL_TABLET | Freq: Once | ORAL | Status: AC
  Administered 2019-10-24: 650 mg via ORAL

## 2019-10-24 MED ORDER — FAMOTIDINE IN NACL 20-0.9 MG/50ML-% IV SOLN
20.0000 mg | Freq: Once | INTRAVENOUS | Status: AC
  Administered 2019-10-24: 20 mg via INTRAVENOUS

## 2019-10-24 MED ORDER — METHYLPREDNISOLONE SODIUM SUCC 125 MG IJ SOLR
125.0000 mg | Freq: Once | INTRAMUSCULAR | Status: AC
  Administered 2019-10-24: 125 mg via INTRAVENOUS

## 2019-10-24 NOTE — Patient Instructions (Signed)
Sodium Ferric Gluconate Complex injection What is this medicine? SODIUM FERRIC GLUCONATE COMPLEX (SOE dee um FER ik GLOO koe nate KOM pleks) is an iron replacement. It is used with epoetin therapy to treat low iron levels in patients who are receiving hemodialysis. This medicine may be used for other purposes; ask your health care provider or pharmacist if you have questions. COMMON BRAND NAME(S): Ferrlecit, Nulecit What should I tell my health care provider before I take this medicine? They need to know if you have any of the following conditions:  anemia that is not from iron deficiency  high levels of iron in the body  an unusual or allergic reaction to iron, benzyl alcohol, other medicines, foods, dyes, or preservatives  pregnant or are trying to become pregnant  breast-feeding How should I use this medicine? This medicine is for infusion into a vein. It is given by a health care professional in a hospital or clinic setting. Talk to your pediatrician regarding the use of this medicine in children. While this drug may be prescribed for children as young as 6 years old for selected conditions, precautions do apply. Overdosage: If you think you have taken too much of this medicine contact a poison control center or emergency room at once. NOTE: This medicine is only for you. Do not share this medicine with others. What if I miss a dose? It is important not to miss your dose. Call your doctor or health care professional if you are unable to keep an appointment. What may interact with this medicine? Do not take this medicine with any of the following medications:  deferoxamine  dimercaprol  other iron products This medicine may also interact with the following medications:  chloramphenicol  deferasirox  medicine for blood pressure like enalapril This list may not describe all possible interactions. Give your health care provider a list of all the medicines, herbs,  non-prescription drugs, or dietary supplements you use. Also tell them if you smoke, drink alcohol, or use illegal drugs. Some items may interact with your medicine. What should I watch for while using this medicine? Your condition will be monitored carefully while you are receiving this medicine. Visit your doctor for check-ups as directed. What side effects may I notice from receiving this medicine? Side effects that you should report to your doctor or health care professional as soon as possible:  allergic reactions like skin rash, itching or hives, swelling of the face, lips, or tongue  breathing problems  changes in hearing  changes in vision  chills, flushing, or sweating  fast, irregular heartbeat  feeling faint or lightheaded, falls  fever, flu-like symptoms  high or low blood pressure  pain, tingling, numbness in the hands or feet  severe pain in the chest, back, flanks, or groin  swelling of the ankles, feet, hands  trouble passing urine or change in the amount of urine  unusually weak or tired Side effects that usually do not require medical attention (report to your doctor or health care professional if they continue or are bothersome):  cramps  dark colored stools  diarrhea  headache  nausea, vomiting  stomach upset This list may not describe all possible side effects. Call your doctor for medical advice about side effects. You may report side effects to FDA at 1-800-FDA-1088. Where should I keep my medicine? This drug is given in a hospital or clinic and will not be stored at home. NOTE: This sheet is a summary. It may not cover all   possible information. If you have questions about this medicine, talk to your doctor, pharmacist, or health care provider.  2020 Elsevier/Gold Standard (2008-04-22 15:58:57)  

## 2019-10-31 ENCOUNTER — Inpatient Hospital Stay: Payer: BC Managed Care – PPO

## 2019-10-31 ENCOUNTER — Other Ambulatory Visit: Payer: Self-pay

## 2019-10-31 VITALS — BP 155/89 | HR 83 | Temp 98.0°F | Resp 16

## 2019-10-31 DIAGNOSIS — D509 Iron deficiency anemia, unspecified: Secondary | ICD-10-CM

## 2019-10-31 DIAGNOSIS — N920 Excessive and frequent menstruation with regular cycle: Secondary | ICD-10-CM | POA: Diagnosis not present

## 2019-10-31 DIAGNOSIS — E639 Nutritional deficiency, unspecified: Secondary | ICD-10-CM | POA: Diagnosis not present

## 2019-10-31 DIAGNOSIS — E119 Type 2 diabetes mellitus without complications: Secondary | ICD-10-CM | POA: Diagnosis not present

## 2019-10-31 DIAGNOSIS — D500 Iron deficiency anemia secondary to blood loss (chronic): Secondary | ICD-10-CM | POA: Diagnosis not present

## 2019-10-31 DIAGNOSIS — Z791 Long term (current) use of non-steroidal anti-inflammatories (NSAID): Secondary | ICD-10-CM | POA: Diagnosis not present

## 2019-10-31 DIAGNOSIS — I10 Essential (primary) hypertension: Secondary | ICD-10-CM | POA: Diagnosis not present

## 2019-10-31 DIAGNOSIS — Z79899 Other long term (current) drug therapy: Secondary | ICD-10-CM | POA: Diagnosis not present

## 2019-10-31 MED ORDER — SODIUM CHLORIDE 0.9 % IV SOLN
Freq: Once | INTRAVENOUS | Status: AC
  Filled 2019-10-31: qty 250

## 2019-10-31 MED ORDER — FAMOTIDINE IN NACL 20-0.9 MG/50ML-% IV SOLN
INTRAVENOUS | Status: AC
  Filled 2019-10-31: qty 50

## 2019-10-31 MED ORDER — METHYLPREDNISOLONE SODIUM SUCC 125 MG IJ SOLR
125.0000 mg | Freq: Once | INTRAMUSCULAR | Status: AC
  Administered 2019-10-31: 125 mg via INTRAVENOUS

## 2019-10-31 MED ORDER — ACETAMINOPHEN 325 MG PO TABS
650.0000 mg | ORAL_TABLET | Freq: Once | ORAL | Status: AC
  Administered 2019-10-31: 650 mg via ORAL

## 2019-10-31 MED ORDER — METHYLPREDNISOLONE SODIUM SUCC 125 MG IJ SOLR
INTRAMUSCULAR | Status: AC
  Filled 2019-10-31: qty 2

## 2019-10-31 MED ORDER — SODIUM CHLORIDE 0.9 % IV SOLN
125.0000 mg | Freq: Once | INTRAVENOUS | Status: AC
  Administered 2019-10-31: 125 mg via INTRAVENOUS
  Filled 2019-10-31: qty 10

## 2019-10-31 MED ORDER — FAMOTIDINE IN NACL 20-0.9 MG/50ML-% IV SOLN
20.0000 mg | Freq: Once | INTRAVENOUS | Status: AC
  Administered 2019-10-31: 20 mg via INTRAVENOUS

## 2019-10-31 MED ORDER — DIPHENHYDRAMINE HCL 50 MG/ML IJ SOLN
INTRAMUSCULAR | Status: AC
  Filled 2019-10-31: qty 1

## 2019-10-31 MED ORDER — DIPHENHYDRAMINE HCL 50 MG/ML IJ SOLN
50.0000 mg | Freq: Once | INTRAMUSCULAR | Status: AC
  Administered 2019-10-31: 50 mg via INTRAVENOUS

## 2019-10-31 MED ORDER — ACETAMINOPHEN 325 MG PO TABS
ORAL_TABLET | ORAL | Status: AC
  Filled 2019-10-31: qty 2

## 2019-10-31 NOTE — Patient Instructions (Signed)
Sodium Ferric Gluconate Complex injection What is this medicine? SODIUM FERRIC GLUCONATE COMPLEX (SOE dee um FER ik GLOO koe nate KOM pleks) is an iron replacement. It is used with epoetin therapy to treat low iron levels in patients who are receiving hemodialysis. This medicine may be used for other purposes; ask your health care provider or pharmacist if you have questions. COMMON BRAND NAME(S): Ferrlecit, Nulecit What should I tell my health care provider before I take this medicine? They need to know if you have any of the following conditions:  anemia that is not from iron deficiency  high levels of iron in the body  an unusual or allergic reaction to iron, benzyl alcohol, other medicines, foods, dyes, or preservatives  pregnant or are trying to become pregnant  breast-feeding How should I use this medicine? This medicine is for infusion into a vein. It is given by a health care professional in a hospital or clinic setting. Talk to your pediatrician regarding the use of this medicine in children. While this drug may be prescribed for children as young as 6 years old for selected conditions, precautions do apply. Overdosage: If you think you have taken too much of this medicine contact a poison control center or emergency room at once. NOTE: This medicine is only for you. Do not share this medicine with others. What if I miss a dose? It is important not to miss your dose. Call your doctor or health care professional if you are unable to keep an appointment. What may interact with this medicine? Do not take this medicine with any of the following medications:  deferoxamine  dimercaprol  other iron products This medicine may also interact with the following medications:  chloramphenicol  deferasirox  medicine for blood pressure like enalapril This list may not describe all possible interactions. Give your health care provider a list of all the medicines, herbs,  non-prescription drugs, or dietary supplements you use. Also tell them if you smoke, drink alcohol, or use illegal drugs. Some items may interact with your medicine. What should I watch for while using this medicine? Your condition will be monitored carefully while you are receiving this medicine. Visit your doctor for check-ups as directed. What side effects may I notice from receiving this medicine? Side effects that you should report to your doctor or health care professional as soon as possible:  allergic reactions like skin rash, itching or hives, swelling of the face, lips, or tongue  breathing problems  changes in hearing  changes in vision  chills, flushing, or sweating  fast, irregular heartbeat  feeling faint or lightheaded, falls  fever, flu-like symptoms  high or low blood pressure  pain, tingling, numbness in the hands or feet  severe pain in the chest, back, flanks, or groin  swelling of the ankles, feet, hands  trouble passing urine or change in the amount of urine  unusually weak or tired Side effects that usually do not require medical attention (report to your doctor or health care professional if they continue or are bothersome):  cramps  dark colored stools  diarrhea  headache  nausea, vomiting  stomach upset This list may not describe all possible side effects. Call your doctor for medical advice about side effects. You may report side effects to FDA at 1-800-FDA-1088. Where should I keep my medicine? This drug is given in a hospital or clinic and will not be stored at home. NOTE: This sheet is a summary. It may not cover all   possible information. If you have questions about this medicine, talk to your doctor, pharmacist, or health care provider.  2020 Elsevier/Gold Standard (2008-04-22 15:58:57)  

## 2019-10-31 NOTE — Progress Notes (Signed)
Pt declined to stay for 30 minute post nulecit observation. 

## 2019-11-18 ENCOUNTER — Other Ambulatory Visit: Payer: Self-pay | Admitting: Nurse Practitioner

## 2019-11-18 DIAGNOSIS — K219 Gastro-esophageal reflux disease without esophagitis: Secondary | ICD-10-CM | POA: Diagnosis not present

## 2019-11-18 DIAGNOSIS — I10 Essential (primary) hypertension: Secondary | ICD-10-CM | POA: Diagnosis not present

## 2019-11-18 DIAGNOSIS — D509 Iron deficiency anemia, unspecified: Secondary | ICD-10-CM | POA: Diagnosis not present

## 2019-11-18 DIAGNOSIS — E119 Type 2 diabetes mellitus without complications: Secondary | ICD-10-CM | POA: Diagnosis not present

## 2019-11-18 DIAGNOSIS — N6452 Nipple discharge: Secondary | ICD-10-CM

## 2019-12-04 ENCOUNTER — Ambulatory Visit
Admission: RE | Admit: 2019-12-04 | Discharge: 2019-12-04 | Disposition: A | Discharge status: Home / Self Care | Payer: BC Managed Care – PPO | Source: Ambulatory Visit | Attending: Nurse Practitioner | Admitting: Nurse Practitioner

## 2019-12-04 ENCOUNTER — Other Ambulatory Visit: Payer: Self-pay | Admitting: Nurse Practitioner

## 2019-12-04 ENCOUNTER — Other Ambulatory Visit: Payer: Self-pay

## 2019-12-04 DIAGNOSIS — N6452 Nipple discharge: Secondary | ICD-10-CM

## 2019-12-04 DIAGNOSIS — N6320 Unspecified lump in the left breast, unspecified quadrant: Secondary | ICD-10-CM

## 2019-12-04 DIAGNOSIS — R922 Inconclusive mammogram: Secondary | ICD-10-CM | POA: Diagnosis not present

## 2019-12-12 ENCOUNTER — Ambulatory Visit
Admission: RE | Admit: 2019-12-12 | Discharge: 2019-12-12 | Disposition: A | Discharge status: Home / Self Care | Payer: BC Managed Care – PPO | Source: Ambulatory Visit | Attending: Nurse Practitioner | Admitting: Nurse Practitioner

## 2019-12-12 ENCOUNTER — Other Ambulatory Visit: Payer: Self-pay

## 2019-12-12 DIAGNOSIS — N6082 Other benign mammary dysplasias of left breast: Secondary | ICD-10-CM | POA: Diagnosis not present

## 2019-12-12 DIAGNOSIS — N6452 Nipple discharge: Secondary | ICD-10-CM

## 2019-12-12 DIAGNOSIS — N6323 Unspecified lump in the left breast, lower outer quadrant: Secondary | ICD-10-CM | POA: Diagnosis not present

## 2019-12-12 DIAGNOSIS — N6320 Unspecified lump in the left breast, unspecified quadrant: Secondary | ICD-10-CM

## 2019-12-23 DIAGNOSIS — N6452 Nipple discharge: Secondary | ICD-10-CM | POA: Diagnosis not present

## 2020-01-05 ENCOUNTER — Inpatient Hospital Stay: Payer: BC Managed Care – PPO

## 2020-01-05 ENCOUNTER — Inpatient Hospital Stay: Payer: BC Managed Care – PPO | Attending: Internal Medicine | Admitting: Internal Medicine

## 2020-01-05 ENCOUNTER — Telehealth: Payer: Self-pay | Admitting: Internal Medicine

## 2020-01-05 ENCOUNTER — Other Ambulatory Visit: Payer: Self-pay

## 2020-01-05 ENCOUNTER — Encounter: Payer: Self-pay | Admitting: Internal Medicine

## 2020-01-05 VITALS — BP 139/89 | HR 102 | Temp 98.5°F | Resp 18 | Ht 71.0 in | Wt 264.9 lb

## 2020-01-05 DIAGNOSIS — D508 Other iron deficiency anemias: Secondary | ICD-10-CM | POA: Diagnosis not present

## 2020-01-05 DIAGNOSIS — Z79899 Other long term (current) drug therapy: Secondary | ICD-10-CM | POA: Diagnosis not present

## 2020-01-05 DIAGNOSIS — D500 Iron deficiency anemia secondary to blood loss (chronic): Secondary | ICD-10-CM | POA: Diagnosis not present

## 2020-01-05 DIAGNOSIS — N920 Excessive and frequent menstruation with regular cycle: Secondary | ICD-10-CM | POA: Insufficient documentation

## 2020-01-05 DIAGNOSIS — E119 Type 2 diabetes mellitus without complications: Secondary | ICD-10-CM | POA: Insufficient documentation

## 2020-01-05 DIAGNOSIS — I10 Essential (primary) hypertension: Secondary | ICD-10-CM | POA: Diagnosis not present

## 2020-01-05 DIAGNOSIS — E639 Nutritional deficiency, unspecified: Secondary | ICD-10-CM | POA: Diagnosis not present

## 2020-01-05 DIAGNOSIS — D509 Iron deficiency anemia, unspecified: Secondary | ICD-10-CM

## 2020-01-05 LAB — CBC WITH DIFFERENTIAL (CANCER CENTER ONLY)
Abs Immature Granulocytes: 0.04 10*3/uL (ref 0.00–0.07)
Basophils Absolute: 0.1 10*3/uL (ref 0.0–0.1)
Basophils Relative: 1 %
Eosinophils Absolute: 0.1 10*3/uL (ref 0.0–0.5)
Eosinophils Relative: 1 %
HCT: 41.6 % (ref 36.0–46.0)
Hemoglobin: 13.7 g/dL (ref 12.0–15.0)
Immature Granulocytes: 1 %
Lymphocytes Relative: 22 %
Lymphs Abs: 1.5 10*3/uL (ref 0.7–4.0)
MCH: 27.6 pg (ref 26.0–34.0)
MCHC: 32.9 g/dL (ref 30.0–36.0)
MCV: 83.7 fL (ref 80.0–100.0)
Monocytes Absolute: 0.5 10*3/uL (ref 0.1–1.0)
Monocytes Relative: 8 %
Neutro Abs: 4.5 10*3/uL (ref 1.7–7.7)
Neutrophils Relative %: 67 %
Platelet Count: 301 10*3/uL (ref 150–400)
RBC: 4.97 MIL/uL (ref 3.87–5.11)
RDW: 14.3 % (ref 11.5–15.5)
WBC Count: 6.6 10*3/uL (ref 4.0–10.5)
nRBC: 0 % (ref 0.0–0.2)

## 2020-01-05 LAB — FERRITIN: Ferritin: 44 ng/mL (ref 11–307)

## 2020-01-05 LAB — IRON AND TIBC
Iron: 36 ug/dL — ABNORMAL LOW (ref 41–142)
Saturation Ratios: 12 % — ABNORMAL LOW (ref 21–57)
TIBC: 308 ug/dL (ref 236–444)
UIBC: 271 ug/dL (ref 120–384)

## 2020-01-05 NOTE — Telephone Encounter (Signed)
Scheduled appt per 5/3 los - pt aware pf appt date and time

## 2020-01-05 NOTE — Progress Notes (Signed)
[facility] [phone]   Fax:([phone]  OFFICE PROGRESS NOTE  Carolee Rota, NP [place] [address]  DIAGNOSIS: persistent iron deficiency anemia of unclear etiology except for the potential blood loss with menstruation and dietary deficiency.  PRIOR THERAPY: Iron infusion with Venofer weekly x4 weeks.  CURRENT THERAPY: None  INTERVAL HISTORY: Chloe Morrow 39 y.o. female returns to the clinic today for follow-up visit.  The patient started feeling more fatigued and tired recently.  She also has craving for ice.  She works as a Investment banker, operational and start feeling lack of stamina at the end of the day.  She denied having any chest pain, shortness of breath, cough or hemoptysis.  She has no weight loss or night sweats.  She has no headache or visual changes.  She is here today for evaluation with repeat CBC, iron study and ferritin.  MEDICAL HISTORY: Past Medical History:  Diagnosis Date  . Diabetes mellitus without complication ([facility])   . History of complete ray amputation of fifth toe of left foot ([facility])   . Hypertension   . Osteomyelitis ([facility])     ALLERGIES:  is allergic to penicillins and sulfa antibiotics.  MEDICATIONS:  Current Outpatient Medications  Medication Sig Dispense Refill  . amLODipine (NORVASC) 5 MG tablet TAKE 1 TABLET(5 MG) BY MOUTH DAILY    . glyBURIDE-metformin (GLUCOVANCE) 5-500 MG tablet Take by mouth.    Marland Kitchen lisinopril-hydrochlorothiazide (ZESTORETIC) 20-12.5 MG tablet Take 1 tablet by mouth 2 (two) times daily.     . traZODone (DESYREL) 50 MG tablet Take 50 mg by mouth at bedtime.    . ondansetron (ZOFRAN) 4 MG tablet Take 4 mg by mouth every 6 (six) hours as needed.    . pantoprazole (PROTONIX) 40 MG tablet Take 40 mg by mouth daily.     No current facility-administered medications for this visit.    SURGICAL HISTORY: No past surgical history on file.  REVIEW OF SYSTEMS:  A comprehensive review of systems  was negative except for: Constitutional: positive for fatigue   PHYSICAL EXAMINATION: General appearance: alert, cooperative and no distress Head: Normocephalic, without obvious abnormality, atraumatic Neck: no adenopathy, no JVD, supple, symmetrical, trachea midline and thyroid not enlarged, symmetric, no tenderness/mass/nodules Lymph nodes: Cervical, supraclavicular, and axillary nodes normal. Resp: clear to auscultation bilaterally Back: symmetric, no curvature. ROM normal. No CVA tenderness. Cardio: regular rate and rhythm, S1, S2 normal, no murmur, click, rub or gallop GI: soft, non-tender; bowel sounds normal; no masses,  no organomegaly Extremities: extremities normal, atraumatic, no cyanosis or edema  ECOG PERFORMANCE STATUS: 1 - Symptomatic but completely ambulatory  Blood pressure 139/89, pulse (!) 102, temperature 98.5 F (36.9 C), temperature source Temporal, resp. rate 18, height _0  (1.803 m), weight 264 lb 14.4 oz (120.2 kg), SpO2 100 %.  LABORATORY DATA: Lab Results  Component Value Date   WBC 6.6 01/05/2020   HGB 13.7 01/05/2020   HCT 41.6 01/05/2020   MCV 83.7 01/05/2020   PLT 301 01/05/2020      Chemistry      Component Value Date/Time   NA 138 06/02/2019 1441   K 3.6 06/02/2019 1441   CL 105 06/02/2019 1441   CO2 22 06/02/2019 1441   BUN 11 06/02/2019 1441   CREATININE 0.83 06/02/2019 1441      Component Value Date/Time   CALCIUM 8.7 (L) 06/02/2019 1441   ALKPHOS 83 06/02/2019 1441   AST 22 06/02/2019  1441   ALT 32 06/02/2019 1441   BILITOT 0.3 06/02/2019 1441       RADIOGRAPHIC STUDIES: MM CLIP PLACEMENT LEFT  Result Date: 12/12/2019 CLINICAL DATA:  Status post ultrasound-guided core biopsy of intraductal mass in the 4 o'clock location of the LEFT breast. EXAM: DIAGNOSTIC LEFT MAMMOGRAM POST ULTRASOUND BIOPSY COMPARISON:  Previous exam(s). FINDINGS: Mammographic images were obtained following ultrasound guided biopsy of intraductal mass in the  4 o'clock location of the LEFT breast and placement of a ribbon shaped clip. The biopsy marking clip is in expected position at the site of biopsy. IMPRESSION: Appropriate positioning of the ribbon shaped shaped biopsy marking clip at the site of biopsy in the LOWER OUTER QUADRANT LEFT breast. Final Assessment: Post Procedure Mammograms for Marker Placement Electronically Signed   By: Nolon Nations M.D.   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 This was found to be concordant by Dr. Nolon Nations. Pathology results were discussed with the patient by telephone. The patient reported doing well after the biopsy with tenderness and bruising at the site. Post biopsy instructions and care were reviewed and questions were answered. The patient was encouraged to call [facility] for any additional concerns. Surgical consultation has been arranged with Dr. Rolm Bookbinder at [facility] on December 23, 2019, with a recommendation for a bilateral breast MRI for further evaluation of spontaneous clear single duct LEFT nipple discharge. Pathology results reported by Terie Purser, RN on 12/15/2019. Electronically Signed   By: Nolon Nations M.D.   On: 12/15/2019 12:00   Result Date: 12/22/2019 CLINICAL DATA:  Patient presents for ultrasound-guided core biopsy of intraductal mass in the 4 o'clock location of the LEFT breast. EXAM: ULTRASOUND GUIDED LEFT BREAST CORE NEEDLE BIOPSY COMPARISON:  Previous exam(s). PROCEDURE: I met with the patient and we discussed the procedure of ultrasound-guided biopsy, including benefits and alternatives. We discussed the high likelihood of a successful procedure. We discussed the risks of the procedure,  including infection, bleeding, tissue injury, clip migration, and inadequate sampling. Informed written consent was given. The usual time-out protocol was performed immediately prior to the procedure. Lesion quadrant: LOWER OUTER QUADRANT LEFT breast Using sterile technique and 1% Lidocaine and 1% lidocaine with epinephrine as local anesthetic, under direct ultrasound visualization, a 12 gauge spring-loaded device was used to perform biopsy of intraductal mass in the 4 o'clock location of the LEFT breast using a inferior to superior approach. At the conclusion of the procedure ribbon shaped tissue marker clip was deployed into the biopsy cavity. Follow up 2 view mammogram was performed and dictated separately. IMPRESSION: Ultrasound guided biopsy of LEFT breast mass. No apparent complications. Electronically Signed: By: Nolon Nations M.D. On: 12/12/2019 08:24    ASSESSMENT AND PLAN: This is a very pleasant 39 years old white female with persistent iron deficiency anemia secondary to blood loss from menorrhagia and dietary deficiency.  The patient is status post iron infusion with Venofer weekly for 4 weeks. The patient tolerated her previous iron infusion fairly well.  She is here today with repeat CBC which showed normal hemoglobin and hematocrit but her serum iron and iron saturation are low.  She is currently on oral iron tablets with Flintstone iron tablet. I  gives the patient the option of iron infusion again and she is interested in proceeding with the iron infusion because of the start of the summer camp soon.  She will be treated with Venofer for the next 4 weeks. I will see her back for follow-up visit in 3 months for evaluation with repeat CBC, iron study and ferritin. The patient voices understanding of current disease status and treatment options and is in agreement with the current care plan.  All questions were answered. The patient knows to call the clinic with any problems, questions or  concerns. We can certainly see the patient much sooner if necessary.  Disclaimer: This note was dictated with voice recognition software. Similar sounding words can inadvertently be transcribed and may not be corrected upon review.

## 2020-01-09 ENCOUNTER — Inpatient Hospital Stay: Payer: BC Managed Care – PPO

## 2020-01-09 ENCOUNTER — Other Ambulatory Visit: Payer: Self-pay

## 2020-01-09 VITALS — BP 150/80 | HR 82 | Temp 98.7°F | Resp 18

## 2020-01-09 DIAGNOSIS — E119 Type 2 diabetes mellitus without complications: Secondary | ICD-10-CM | POA: Diagnosis not present

## 2020-01-09 DIAGNOSIS — D509 Iron deficiency anemia, unspecified: Secondary | ICD-10-CM

## 2020-01-09 DIAGNOSIS — I10 Essential (primary) hypertension: Secondary | ICD-10-CM | POA: Diagnosis not present

## 2020-01-09 DIAGNOSIS — Z79899 Other long term (current) drug therapy: Secondary | ICD-10-CM | POA: Diagnosis not present

## 2020-01-09 DIAGNOSIS — D500 Iron deficiency anemia secondary to blood loss (chronic): Secondary | ICD-10-CM | POA: Diagnosis not present

## 2020-01-09 DIAGNOSIS — E639 Nutritional deficiency, unspecified: Secondary | ICD-10-CM | POA: Diagnosis not present

## 2020-01-09 DIAGNOSIS — N920 Excessive and frequent menstruation with regular cycle: Secondary | ICD-10-CM | POA: Diagnosis not present

## 2020-01-09 MED ORDER — METHYLPREDNISOLONE SODIUM SUCC 125 MG IJ SOLR
INTRAMUSCULAR | Status: AC
  Filled 2020-01-09: qty 2

## 2020-01-09 MED ORDER — SODIUM CHLORIDE 0.9 % IV SOLN
Freq: Once | INTRAVENOUS | Status: AC
  Filled 2020-01-09: qty 250

## 2020-01-09 MED ORDER — ACETAMINOPHEN 325 MG PO TABS
ORAL_TABLET | ORAL | Status: AC
  Filled 2020-01-09: qty 2

## 2020-01-09 MED ORDER — FAMOTIDINE IN NACL 20-0.9 MG/50ML-% IV SOLN
INTRAVENOUS | Status: AC
  Filled 2020-01-09: qty 50

## 2020-01-09 MED ORDER — DIPHENHYDRAMINE HCL 25 MG PO CAPS
50.0000 mg | ORAL_CAPSULE | Freq: Once | ORAL | Status: AC
  Administered 2020-01-09: 50 mg via ORAL

## 2020-01-09 MED ORDER — DIPHENHYDRAMINE HCL 25 MG PO CAPS
ORAL_CAPSULE | ORAL | Status: AC
  Filled 2020-01-09: qty 2

## 2020-01-09 MED ORDER — ACETAMINOPHEN 325 MG PO TABS
650.0000 mg | ORAL_TABLET | Freq: Once | ORAL | Status: AC
  Administered 2020-01-09: 650 mg via ORAL

## 2020-01-09 MED ORDER — FAMOTIDINE IN NACL 20-0.9 MG/50ML-% IV SOLN
20.0000 mg | Freq: Once | INTRAVENOUS | Status: AC
  Administered 2020-01-09: 20 mg via INTRAVENOUS

## 2020-01-09 MED ORDER — SODIUM CHLORIDE 0.9 % IV SOLN
125.0000 mg | Freq: Once | INTRAVENOUS | Status: AC
  Administered 2020-01-09: 125 mg via INTRAVENOUS
  Filled 2020-01-09: qty 10

## 2020-01-09 MED ORDER — METHYLPREDNISOLONE SODIUM SUCC 125 MG IJ SOLR
125.0000 mg | Freq: Once | INTRAMUSCULAR | Status: AC
  Administered 2020-01-09: 125 mg via INTRAVENOUS

## 2020-01-09 NOTE — Progress Notes (Signed)
Patient declined to stay for her 30 minute post-observation. VSS upon discharge.

## 2020-01-16 ENCOUNTER — Other Ambulatory Visit: Payer: Self-pay

## 2020-01-16 ENCOUNTER — Inpatient Hospital Stay: Payer: BC Managed Care – PPO

## 2020-01-16 VITALS — BP 138/80 | HR 88 | Temp 98.0°F | Resp 16

## 2020-01-16 DIAGNOSIS — N920 Excessive and frequent menstruation with regular cycle: Secondary | ICD-10-CM | POA: Diagnosis not present

## 2020-01-16 DIAGNOSIS — Z79899 Other long term (current) drug therapy: Secondary | ICD-10-CM | POA: Diagnosis not present

## 2020-01-16 DIAGNOSIS — I10 Essential (primary) hypertension: Secondary | ICD-10-CM | POA: Diagnosis not present

## 2020-01-16 DIAGNOSIS — E119 Type 2 diabetes mellitus without complications: Secondary | ICD-10-CM | POA: Diagnosis not present

## 2020-01-16 DIAGNOSIS — E639 Nutritional deficiency, unspecified: Secondary | ICD-10-CM | POA: Diagnosis not present

## 2020-01-16 DIAGNOSIS — D509 Iron deficiency anemia, unspecified: Secondary | ICD-10-CM

## 2020-01-16 DIAGNOSIS — D500 Iron deficiency anemia secondary to blood loss (chronic): Secondary | ICD-10-CM | POA: Diagnosis not present

## 2020-01-16 MED ORDER — DIPHENHYDRAMINE HCL 25 MG PO CAPS
50.0000 mg | ORAL_CAPSULE | Freq: Once | ORAL | Status: AC
  Administered 2020-01-16: 50 mg via ORAL

## 2020-01-16 MED ORDER — ACETAMINOPHEN 325 MG PO TABS
650.0000 mg | ORAL_TABLET | Freq: Once | ORAL | Status: AC
  Administered 2020-01-16: 650 mg via ORAL

## 2020-01-16 MED ORDER — DIPHENHYDRAMINE HCL 25 MG PO CAPS
ORAL_CAPSULE | ORAL | Status: AC
  Filled 2020-01-16: qty 2

## 2020-01-16 MED ORDER — FAMOTIDINE IN NACL 20-0.9 MG/50ML-% IV SOLN
20.0000 mg | Freq: Once | INTRAVENOUS | Status: AC
  Administered 2020-01-16: 20 mg via INTRAVENOUS

## 2020-01-16 MED ORDER — ACETAMINOPHEN 325 MG PO TABS
ORAL_TABLET | ORAL | Status: AC
  Filled 2020-01-16: qty 2

## 2020-01-16 MED ORDER — SODIUM CHLORIDE 0.9 % IV SOLN
125.0000 mg | Freq: Once | INTRAVENOUS | Status: AC
  Administered 2020-01-16: 125 mg via INTRAVENOUS
  Filled 2020-01-16: qty 10

## 2020-01-16 MED ORDER — SODIUM CHLORIDE 0.9 % IV SOLN
Freq: Once | INTRAVENOUS | Status: AC
  Filled 2020-01-16: qty 250

## 2020-01-16 MED ORDER — METHYLPREDNISOLONE SODIUM SUCC 125 MG IJ SOLR
125.0000 mg | Freq: Once | INTRAMUSCULAR | Status: AC
  Administered 2020-01-16: 125 mg via INTRAVENOUS

## 2020-01-16 MED ORDER — METHYLPREDNISOLONE SODIUM SUCC 125 MG IJ SOLR
INTRAMUSCULAR | Status: AC
  Filled 2020-01-16: qty 2

## 2020-01-16 MED ORDER — FAMOTIDINE IN NACL 20-0.9 MG/50ML-% IV SOLN
INTRAVENOUS | Status: AC
  Filled 2020-01-16: qty 50

## 2020-01-16 NOTE — Patient Instructions (Signed)
Sodium Ferric Gluconate Complex injection What is this medicine? SODIUM FERRIC GLUCONATE COMPLEX (SOE dee um FER ik GLOO koe nate KOM pleks) is an iron replacement. It is used with epoetin therapy to treat low iron levels in patients who are receiving hemodialysis. This medicine may be used for other purposes; ask your health care provider or pharmacist if you have questions. COMMON BRAND NAME(S): Ferrlecit, Nulecit What should I tell my health care provider before I take this medicine? They need to know if you have any of the following conditions:  anemia that is not from iron deficiency  high levels of iron in the body  an unusual or allergic reaction to iron, benzyl alcohol, other medicines, foods, dyes, or preservatives  pregnant or are trying to become pregnant  breast-feeding How should I use this medicine? This medicine is for infusion into a vein. It is given by a health care professional in a hospital or clinic setting. Talk to your pediatrician regarding the use of this medicine in children. While this drug may be prescribed for children as young as 6 years old for selected conditions, precautions do apply. Overdosage: If you think you have taken too much of this medicine contact a poison control center or emergency room at once. NOTE: This medicine is only for you. Do not share this medicine with others. What if I miss a dose? It is important not to miss your dose. Call your doctor or health care professional if you are unable to keep an appointment. What may interact with this medicine? Do not take this medicine with any of the following medications:  deferoxamine  dimercaprol  other iron products This medicine may also interact with the following medications:  chloramphenicol  deferasirox  medicine for blood pressure like enalapril This list may not describe all possible interactions. Give your health care provider a list of all the medicines, herbs,  non-prescription drugs, or dietary supplements you use. Also tell them if you smoke, drink alcohol, or use illegal drugs. Some items may interact with your medicine. What should I watch for while using this medicine? Your condition will be monitored carefully while you are receiving this medicine. Visit your doctor for check-ups as directed. What side effects may I notice from receiving this medicine? Side effects that you should report to your doctor or health care professional as soon as possible:  allergic reactions like skin rash, itching or hives, swelling of the face, lips, or tongue  breathing problems  changes in hearing  changes in vision  chills, flushing, or sweating  fast, irregular heartbeat  feeling faint or lightheaded, falls  fever, flu-like symptoms  high or low blood pressure  pain, tingling, numbness in the hands or feet  severe pain in the chest, back, flanks, or groin  swelling of the ankles, feet, hands  trouble passing urine or change in the amount of urine  unusually weak or tired Side effects that usually do not require medical attention (report to your doctor or health care professional if they continue or are bothersome):  cramps  dark colored stools  diarrhea  headache  nausea, vomiting  stomach upset This list may not describe all possible side effects. Call your doctor for medical advice about side effects. You may report side effects to FDA at 1-800-FDA-1088. Where should I keep my medicine? This drug is given in a hospital or clinic and will not be stored at home. NOTE: This sheet is a summary. It may not cover all   possible information. If you have questions about this medicine, talk to your doctor, pharmacist, or health care provider.  2020 Elsevier/Gold Standard (2008-04-22 15:58:57)  

## 2020-01-23 ENCOUNTER — Other Ambulatory Visit: Payer: Self-pay

## 2020-01-23 ENCOUNTER — Inpatient Hospital Stay: Payer: BC Managed Care – PPO

## 2020-01-23 VITALS — BP 115/85 | HR 82 | Temp 98.4°F | Resp 16

## 2020-01-23 DIAGNOSIS — N920 Excessive and frequent menstruation with regular cycle: Secondary | ICD-10-CM | POA: Diagnosis not present

## 2020-01-23 DIAGNOSIS — E119 Type 2 diabetes mellitus without complications: Secondary | ICD-10-CM | POA: Diagnosis not present

## 2020-01-23 DIAGNOSIS — D509 Iron deficiency anemia, unspecified: Secondary | ICD-10-CM

## 2020-01-23 DIAGNOSIS — I10 Essential (primary) hypertension: Secondary | ICD-10-CM | POA: Diagnosis not present

## 2020-01-23 DIAGNOSIS — D500 Iron deficiency anemia secondary to blood loss (chronic): Secondary | ICD-10-CM | POA: Diagnosis not present

## 2020-01-23 DIAGNOSIS — Z79899 Other long term (current) drug therapy: Secondary | ICD-10-CM | POA: Diagnosis not present

## 2020-01-23 DIAGNOSIS — E639 Nutritional deficiency, unspecified: Secondary | ICD-10-CM | POA: Diagnosis not present

## 2020-01-23 MED ORDER — DIPHENHYDRAMINE HCL 25 MG PO CAPS
50.0000 mg | ORAL_CAPSULE | Freq: Once | ORAL | Status: AC
  Administered 2020-01-23: 50 mg via ORAL

## 2020-01-23 MED ORDER — FAMOTIDINE IN NACL 20-0.9 MG/50ML-% IV SOLN
INTRAVENOUS | Status: AC
  Filled 2020-01-23: qty 50

## 2020-01-23 MED ORDER — DIPHENHYDRAMINE HCL 25 MG PO CAPS
ORAL_CAPSULE | ORAL | Status: AC
  Filled 2020-01-23: qty 2

## 2020-01-23 MED ORDER — SODIUM CHLORIDE 0.9 % IV SOLN
125.0000 mg | Freq: Once | INTRAVENOUS | Status: AC
  Administered 2020-01-23: 125 mg via INTRAVENOUS
  Filled 2020-01-23: qty 10

## 2020-01-23 MED ORDER — FAMOTIDINE IN NACL 20-0.9 MG/50ML-% IV SOLN
20.0000 mg | Freq: Once | INTRAVENOUS | Status: AC
  Administered 2020-01-23: 20 mg via INTRAVENOUS

## 2020-01-23 MED ORDER — ACETAMINOPHEN 325 MG PO TABS
ORAL_TABLET | ORAL | Status: AC
  Filled 2020-01-23: qty 2

## 2020-01-23 MED ORDER — ACETAMINOPHEN 325 MG PO TABS
650.0000 mg | ORAL_TABLET | Freq: Once | ORAL | Status: AC
  Administered 2020-01-23: 650 mg via ORAL

## 2020-01-23 MED ORDER — SODIUM CHLORIDE 0.9 % IV SOLN
Freq: Once | INTRAVENOUS | Status: AC
  Filled 2020-01-23: qty 250

## 2020-01-23 MED ORDER — METHYLPREDNISOLONE SODIUM SUCC 125 MG IJ SOLR
INTRAMUSCULAR | Status: AC
  Filled 2020-01-23: qty 2

## 2020-01-23 MED ORDER — METHYLPREDNISOLONE SODIUM SUCC 125 MG IJ SOLR
125.0000 mg | Freq: Once | INTRAMUSCULAR | Status: AC
  Administered 2020-01-23: 125 mg via INTRAVENOUS

## 2020-01-23 NOTE — Patient Instructions (Signed)
Sodium Ferric Gluconate Complex injection What is this medicine? SODIUM FERRIC GLUCONATE COMPLEX (SOE dee um FER ik GLOO koe nate KOM pleks) is an iron replacement. It is used with epoetin therapy to treat low iron levels in patients who are receiving hemodialysis. This medicine may be used for other purposes; ask your health care provider or pharmacist if you have questions. COMMON BRAND NAME(S): Ferrlecit, Nulecit What should I tell my health care provider before I take this medicine? They need to know if you have any of the following conditions:  anemia that is not from iron deficiency  high levels of iron in the body  an unusual or allergic reaction to iron, benzyl alcohol, other medicines, foods, dyes, or preservatives  pregnant or are trying to become pregnant  breast-feeding How should I use this medicine? This medicine is for infusion into a vein. It is given by a health care professional in a hospital or clinic setting. Talk to your pediatrician regarding the use of this medicine in children. While this drug may be prescribed for children as young as 6 years old for selected conditions, precautions do apply. Overdosage: If you think you have taken too much of this medicine contact a poison control center or emergency room at once. NOTE: This medicine is only for you. Do not share this medicine with others. What if I miss a dose? It is important not to miss your dose. Call your doctor or health care professional if you are unable to keep an appointment. What may interact with this medicine? Do not take this medicine with any of the following medications:  deferoxamine  dimercaprol  other iron products This medicine may also interact with the following medications:  chloramphenicol  deferasirox  medicine for blood pressure like enalapril This list may not describe all possible interactions. Give your health care provider a list of all the medicines, herbs,  non-prescription drugs, or dietary supplements you use. Also tell them if you smoke, drink alcohol, or use illegal drugs. Some items may interact with your medicine. What should I watch for while using this medicine? Your condition will be monitored carefully while you are receiving this medicine. Visit your doctor for check-ups as directed. What side effects may I notice from receiving this medicine? Side effects that you should report to your doctor or health care professional as soon as possible:  allergic reactions like skin rash, itching or hives, swelling of the face, lips, or tongue  breathing problems  changes in hearing  changes in vision  chills, flushing, or sweating  fast, irregular heartbeat  feeling faint or lightheaded, falls  fever, flu-like symptoms  high or low blood pressure  pain, tingling, numbness in the hands or feet  severe pain in the chest, back, flanks, or groin  swelling of the ankles, feet, hands  trouble passing urine or change in the amount of urine  unusually weak or tired Side effects that usually do not require medical attention (report to your doctor or health care professional if they continue or are bothersome):  cramps  dark colored stools  diarrhea  headache  nausea, vomiting  stomach upset This list may not describe all possible side effects. Call your doctor for medical advice about side effects. You may report side effects to FDA at 1-800-FDA-1088. Where should I keep my medicine? This drug is given in a hospital or clinic and will not be stored at home. NOTE: This sheet is a summary. It may not cover all   possible information. If you have questions about this medicine, talk to your doctor, pharmacist, or health care provider.  2020 Elsevier/Gold Standard (2008-04-22 15:58:57)  

## 2020-01-30 ENCOUNTER — Inpatient Hospital Stay: Payer: BC Managed Care – PPO

## 2020-01-30 ENCOUNTER — Other Ambulatory Visit: Payer: Self-pay

## 2020-01-30 VITALS — BP 156/95 | HR 91 | Temp 99.4°F | Resp 17

## 2020-01-30 DIAGNOSIS — D509 Iron deficiency anemia, unspecified: Secondary | ICD-10-CM

## 2020-01-30 DIAGNOSIS — E639 Nutritional deficiency, unspecified: Secondary | ICD-10-CM | POA: Diagnosis not present

## 2020-01-30 DIAGNOSIS — Z79899 Other long term (current) drug therapy: Secondary | ICD-10-CM | POA: Diagnosis not present

## 2020-01-30 DIAGNOSIS — E119 Type 2 diabetes mellitus without complications: Secondary | ICD-10-CM | POA: Diagnosis not present

## 2020-01-30 DIAGNOSIS — I10 Essential (primary) hypertension: Secondary | ICD-10-CM | POA: Diagnosis not present

## 2020-01-30 DIAGNOSIS — N920 Excessive and frequent menstruation with regular cycle: Secondary | ICD-10-CM | POA: Diagnosis not present

## 2020-01-30 DIAGNOSIS — D500 Iron deficiency anemia secondary to blood loss (chronic): Secondary | ICD-10-CM | POA: Diagnosis not present

## 2020-01-30 MED ORDER — METHYLPREDNISOLONE SODIUM SUCC 125 MG IJ SOLR
INTRAMUSCULAR | Status: AC
  Filled 2020-01-30: qty 2

## 2020-01-30 MED ORDER — ACETAMINOPHEN 325 MG PO TABS
ORAL_TABLET | ORAL | Status: AC
  Filled 2020-01-30: qty 2

## 2020-01-30 MED ORDER — METHYLPREDNISOLONE SODIUM SUCC 125 MG IJ SOLR
125.0000 mg | Freq: Once | INTRAMUSCULAR | Status: AC
  Administered 2020-01-30: 125 mg via INTRAVENOUS

## 2020-01-30 MED ORDER — FAMOTIDINE IN NACL 20-0.9 MG/50ML-% IV SOLN
20.0000 mg | Freq: Once | INTRAVENOUS | Status: AC
  Administered 2020-01-30: 20 mg via INTRAVENOUS

## 2020-01-30 MED ORDER — SODIUM CHLORIDE 0.9 % IV SOLN
125.0000 mg | Freq: Once | INTRAVENOUS | Status: AC
  Administered 2020-01-30: 125 mg via INTRAVENOUS
  Filled 2020-01-30: qty 10

## 2020-01-30 MED ORDER — SODIUM CHLORIDE 0.9 % IV SOLN
Freq: Once | INTRAVENOUS | Status: AC
  Filled 2020-01-30: qty 250

## 2020-01-30 MED ORDER — ACETAMINOPHEN 325 MG PO TABS
650.0000 mg | ORAL_TABLET | Freq: Once | ORAL | Status: AC
  Administered 2020-01-30: 650 mg via ORAL

## 2020-01-30 MED ORDER — DIPHENHYDRAMINE HCL 25 MG PO CAPS
ORAL_CAPSULE | ORAL | Status: AC
  Filled 2020-01-30: qty 2

## 2020-01-30 MED ORDER — FAMOTIDINE IN NACL 20-0.9 MG/50ML-% IV SOLN
INTRAVENOUS | Status: AC
  Filled 2020-01-30: qty 50

## 2020-01-30 MED ORDER — DIPHENHYDRAMINE HCL 25 MG PO CAPS
50.0000 mg | ORAL_CAPSULE | Freq: Once | ORAL | Status: AC
  Administered 2020-01-30: 50 mg via ORAL

## 2020-04-05 NOTE — Progress Notes (Signed)
[facility] OFFICE PROGRESS NOTE  [name], NP [address] [place] [address]  DIAGNOSIS: Persistent iron deficiency anemia of unclear etiology except for the potential blood loss with menstruation and dietary deficiency.  PRIOR THERAPY: Iron infusion with Venofer weekly x4 weeks PRN. Last dose 01/30/20  CURRENT THERAPY: Oral iron supplements  INTERVAL HISTORY: Chloe Morrow 39 y.o. female returns to the clinic today for a follow-up visit.  The patient is feeling fairly well today without any concerning complaints. The patient receives IV iron infusions with Venofer as needed for iron deficiency.  The last infusion of Venofer was on 01/30/2020.  The patient takes a Flintstone oral multivitamin with iron because she is able to tolerate it. She is compliant with this. She had nausea/vomiting with other iron supplements in the past. Her anemia is thought to be secondary to dietary deficiency and blood loss. She had a endoscopy and colonoscopy in December 2020 to evaluate for GI blood loss or celiacs disease. Her procedure was unremarkable for an etiology of her anemia. Of note, the patient does take Protonix. Today, the patient reports fatigue. She is a residential Printmaker and notes that when she becomes iron deficient that she feels short of breath with exertion as well as her fingers/toes get colder.  Her mensual periods are every 4 weeks and lasts 5 days or so.  She denies any abnormal bleeding including gingival bleeding, epistaxis, melena, hematochezia, hemoptysis, hematuria, or hematemesis. The patient is here today for evaluation and a repeat CBC, iron studies, and ferritin.  MEDICAL HISTORY: Past Medical History:  Diagnosis Date  . Diabetes mellitus without complication ([facility])   . 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 Hypertension   . Osteomyelitis ([facility])     ALLERGIES:  is allergic to penicillins and sulfa  antibiotics.  MEDICATIONS:  Current Outpatient Medications  Medication Sig Dispense Refill  . ADDERALL XR 20 MG 24 hr capsule Take 1 capsule by mouth daily.    Marland Kitchen amLODipine (NORVASC) 5 MG tablet TAKE 1 TABLET(5 MG) BY MOUTH DAILY    . glyBURIDE-metformin (GLUCOVANCE) 5-500 MG tablet Take by mouth.    Marland Kitchen lisinopril-hydrochlorothiazide (ZESTORETIC) 20-12.5 MG tablet Take 1 tablet by mouth 2 (two) times daily.     . pantoprazole (PROTONIX) 40 MG tablet Take 40 mg by mouth daily.    . traZODone (DESYREL) 50 MG tablet Take 50 mg by mouth at bedtime.     No current facility-administered medications for this visit.    SURGICAL HISTORY: No past surgical history on file.  REVIEW OF SYSTEMS:   Review of Systems  Constitutional: Positive for fatigue. Negative for appetite change, chills, fever and unexpected weight change.  HENT: Negative for mouth sores, nosebleeds, sore throat and trouble swallowing.   Eyes: Negative for eye problems and icterus.  Respiratory: Positive for progressive dyspnea on exertion. Negative for cough, hemoptysis,  and wheezing.   Cardiovascular: Negative for chest pain and leg swelling.  Gastrointestinal: Negative for abdominal pain, constipation, diarrhea, nausea and vomiting.  Genitourinary: Negative for bladder incontinence, difficulty urinating, dysuria, frequency and hematuria.   Musculoskeletal: Negative for back pain, gait problem, neck pain and neck stiffness.  Skin: Negative for itching and rash.  Neurological: Negative for dizziness, extremity weakness, gait problem, headaches, light-headedness and seizures.  Hematological: Negative for adenopathy. Does not bruise/bleed easily.  Psychiatric/Behavioral: Negative for confusion, depression and sleep disturbance. The patient is not nervous/anxious.  PHYSICAL EXAMINATION:  Blood pressure (!) 166/111, pulse (!) 110, temperature 97.6 F (36.4 C), temperature source Temporal, resp. rate 18, height 5\' 11"  (1.803  m), weight 258 lb 12.8 oz (117.4 kg), SpO2 98 %.  ECOG PERFORMANCE STATUS: 1 - Symptomatic but completely ambulatory  Physical Exam  Constitutional: Oriented to person, place, and time and well-developed, well-nourished, and in no distress.  HENT:  Head: Normocephalic and atraumatic.  Mouth/Throat: Oropharynx is clear and moist. No oropharyngeal exudate.  Eyes: Conjunctivae are normal. Right eye exhibits no discharge. Left eye exhibits no discharge. No scleral icterus.  Neck: Normal range of motion. Neck supple.  Cardiovascular: Normal rate, regular rhythm, normal heart sounds and intact distal pulses.   Pulmonary/Chest: Effort normal and breath sounds normal. No respiratory distress. No wheezes. No rales.  Abdominal: Soft. Bowel sounds are normal. Exhibits no distension and no mass. There is no tenderness.  Musculoskeletal: Normal range of motion. Exhibits no edema.  Lymphadenopathy:    No cervical adenopathy.  Neurological: Alert and oriented to person, place, and time. Exhibits normal muscle tone. Gait normal. Coordination normal.  Skin: Skin is warm and dry. No rash noted. Not diaphoretic. No erythema. No pallor.  Psychiatric: Mood, memory and judgment normal.  Vitals reviewed.  LABORATORY DATA: Lab Results  Component Value Date   WBC 7.4 04/06/2020   HGB 14.6 04/06/2020   HCT 42.9 04/06/2020   MCV 82.3 04/06/2020   PLT 363 04/06/2020      Chemistry      Component Value Date/Time   NA 138 06/02/2019 1441   K 3.6 06/02/2019 1441   CL 105 06/02/2019 1441   CO2 22 06/02/2019 1441   BUN 11 06/02/2019 1441   CREATININE 0.83 06/02/2019 1441      Component Value Date/Time   CALCIUM 8.7 (L) 06/02/2019 1441   ALKPHOS 83 06/02/2019 1441   AST 22 06/02/2019 1441   ALT 32 06/02/2019 1441   BILITOT 0.3 06/02/2019 1441       RADIOGRAPHIC STUDIES:  No results found.   ASSESSMENT/PLAN:  This is a very pleasant 39 year old Caucasian female with persistent iron deficiency  anemia thought to be secondary to blood loss from menorrhagia and dietary deficiency.  The patient receives IV iron with Venofer as needed.  Her last dose was on 01/30/2020.  The patient had a repeat CBC, iron studies, and ferritin performed today.  Her CBC is unremarkable. Her Hbg is WNL. Her iron studies and ferritin are still pending at this time.  I will personally call the patient and arrange for an iron infusion with Venofer if needed based on the pending lab studies if she continues to have persistent and significant iron deficiency.  She will continue to take her oral iron supplement.   I will tentatively plan on arranging for a follow-up visit in 3 months for evaluation and repeat CBC, iron studies, ferritin.  The patient's BP and pulse are elevated today. She states that she has white coat syndrome. She states she monitors her BP closely at home and it is WNL. I advised her to monitor her BP at home and to follow up with her PCP if it continues to be high for consideration of a dose adjustment to her anti-hypertensives.   The patient was advised to call immediately if she has any concerning symptoms in the interval. The patient voices understanding of current disease status and treatment options and is in agreement with the current care plan. All questions were answered. The  patient knows to call the clinic with any problems, questions or concerns. We can certainly see the patient much sooner if necessary     Orders Placed This Encounter  Procedures  . CBC with Differential (Cancer Center Only)    Standing Status:   Future    Standing Expiration Date:   04/06/2021  . Ferritin    Standing Status:   Future    Standing Expiration Date:   04/06/2021  . Iron and TIBC    Standing Status:   Future    Standing Expiration Date:   04/06/2021     Brennin Durfee L Timiyah Romito, PA-C 04/06/20

## 2020-04-06 ENCOUNTER — Inpatient Hospital Stay: Payer: BC Managed Care – PPO

## 2020-04-06 ENCOUNTER — Inpatient Hospital Stay: Payer: BC Managed Care – PPO | Attending: Internal Medicine | Admitting: Physician Assistant

## 2020-04-06 ENCOUNTER — Other Ambulatory Visit: Payer: Self-pay

## 2020-04-06 VITALS — BP 166/111 | HR 110 | Temp 97.6°F | Resp 18 | Ht 71.0 in | Wt 258.8 lb

## 2020-04-06 DIAGNOSIS — D509 Iron deficiency anemia, unspecified: Secondary | ICD-10-CM

## 2020-04-06 DIAGNOSIS — E119 Type 2 diabetes mellitus without complications: Secondary | ICD-10-CM | POA: Insufficient documentation

## 2020-04-06 DIAGNOSIS — I10 Essential (primary) hypertension: Secondary | ICD-10-CM

## 2020-04-06 DIAGNOSIS — N920 Excessive and frequent menstruation with regular cycle: Secondary | ICD-10-CM | POA: Insufficient documentation

## 2020-04-06 DIAGNOSIS — D508 Other iron deficiency anemias: Secondary | ICD-10-CM

## 2020-04-06 DIAGNOSIS — Z79899 Other long term (current) drug therapy: Secondary | ICD-10-CM | POA: Insufficient documentation

## 2020-04-06 DIAGNOSIS — D500 Iron deficiency anemia secondary to blood loss (chronic): Secondary | ICD-10-CM | POA: Insufficient documentation

## 2020-04-06 LAB — CBC WITH DIFFERENTIAL (CANCER CENTER ONLY)
Abs Immature Granulocytes: 0.07 10*3/uL (ref 0.00–0.07)
Basophils Absolute: 0.1 10*3/uL (ref 0.0–0.1)
Basophils Relative: 1 %
Eosinophils Absolute: 0.1 10*3/uL (ref 0.0–0.5)
Eosinophils Relative: 1 %
HCT: 42.9 % (ref 36.0–46.0)
Hemoglobin: 14.6 g/dL (ref 12.0–15.0)
Immature Granulocytes: 1 %
Lymphocytes Relative: 30 %
Lymphs Abs: 2.2 10*3/uL (ref 0.7–4.0)
MCH: 28 pg (ref 26.0–34.0)
MCHC: 34 g/dL (ref 30.0–36.0)
MCV: 82.3 fL (ref 80.0–100.0)
Monocytes Absolute: 0.4 10*3/uL (ref 0.1–1.0)
Monocytes Relative: 6 %
Neutro Abs: 4.5 10*3/uL (ref 1.7–7.7)
Neutrophils Relative %: 61 %
Platelet Count: 363 10*3/uL (ref 150–400)
RBC: 5.21 MIL/uL — ABNORMAL HIGH (ref 3.87–5.11)
RDW: 13.2 % (ref 11.5–15.5)
WBC Count: 7.4 10*3/uL (ref 4.0–10.5)
nRBC: 0 % (ref 0.0–0.2)

## 2020-04-06 LAB — IRON AND TIBC
Iron: 55 ug/dL (ref 41–142)
Saturation Ratios: 18 % — ABNORMAL LOW (ref 21–57)
TIBC: 299 ug/dL (ref 236–444)
UIBC: 244 ug/dL (ref 120–384)

## 2020-04-06 LAB — FERRITIN: Ferritin: 48 ng/mL (ref 11–307)

## 2020-04-07 ENCOUNTER — Telehealth: Payer: Self-pay | Admitting: Physician Assistant

## 2020-04-07 NOTE — Telephone Encounter (Signed)
I called the patient to let her know she does not need an iron infusion at this time. She was instructed to continue to take her oral iron supplement. We will see her back in 3 months for evaluation and repeat blood work. She expressed understanding

## 2020-04-07 NOTE — Telephone Encounter (Signed)
Scheduled per los. Called and spoke with patient. Confirmed appt 

## 2020-04-09 DIAGNOSIS — I152 Hypertension secondary to endocrine disorders: Secondary | ICD-10-CM | POA: Diagnosis not present

## 2020-04-09 DIAGNOSIS — E1159 Type 2 diabetes mellitus with other circulatory complications: Secondary | ICD-10-CM | POA: Diagnosis not present

## 2020-04-09 DIAGNOSIS — K219 Gastro-esophageal reflux disease without esophagitis: Secondary | ICD-10-CM | POA: Diagnosis not present

## 2020-04-09 DIAGNOSIS — G479 Sleep disorder, unspecified: Secondary | ICD-10-CM | POA: Diagnosis not present

## 2020-04-12 DIAGNOSIS — M5411 Radiculopathy, occipito-atlanto-axial region: Secondary | ICD-10-CM | POA: Diagnosis not present

## 2020-04-12 DIAGNOSIS — M5412 Radiculopathy, cervical region: Secondary | ICD-10-CM | POA: Diagnosis not present

## 2020-04-12 DIAGNOSIS — G43C0 Periodic headache syndromes in child or adult, not intractable: Secondary | ICD-10-CM | POA: Diagnosis not present

## 2020-04-12 DIAGNOSIS — M531 Cervicobrachial syndrome: Secondary | ICD-10-CM | POA: Diagnosis not present

## 2020-04-14 DIAGNOSIS — M531 Cervicobrachial syndrome: Secondary | ICD-10-CM | POA: Diagnosis not present

## 2020-04-14 DIAGNOSIS — M5412 Radiculopathy, cervical region: Secondary | ICD-10-CM | POA: Diagnosis not present

## 2020-04-14 DIAGNOSIS — G43C0 Periodic headache syndromes in child or adult, not intractable: Secondary | ICD-10-CM | POA: Diagnosis not present

## 2020-04-14 DIAGNOSIS — M5411 Radiculopathy, occipito-atlanto-axial region: Secondary | ICD-10-CM | POA: Diagnosis not present

## 2020-04-16 DIAGNOSIS — M531 Cervicobrachial syndrome: Secondary | ICD-10-CM | POA: Diagnosis not present

## 2020-04-16 DIAGNOSIS — M5412 Radiculopathy, cervical region: Secondary | ICD-10-CM | POA: Diagnosis not present

## 2020-04-16 DIAGNOSIS — G43C0 Periodic headache syndromes in child or adult, not intractable: Secondary | ICD-10-CM | POA: Diagnosis not present

## 2020-04-16 DIAGNOSIS — M5411 Radiculopathy, occipito-atlanto-axial region: Secondary | ICD-10-CM | POA: Diagnosis not present

## 2020-04-19 DIAGNOSIS — M5411 Radiculopathy, occipito-atlanto-axial region: Secondary | ICD-10-CM | POA: Diagnosis not present

## 2020-04-19 DIAGNOSIS — M531 Cervicobrachial syndrome: Secondary | ICD-10-CM | POA: Diagnosis not present

## 2020-04-19 DIAGNOSIS — G43C0 Periodic headache syndromes in child or adult, not intractable: Secondary | ICD-10-CM | POA: Diagnosis not present

## 2020-04-19 DIAGNOSIS — M5412 Radiculopathy, cervical region: Secondary | ICD-10-CM | POA: Diagnosis not present

## 2020-04-21 DIAGNOSIS — M531 Cervicobrachial syndrome: Secondary | ICD-10-CM | POA: Diagnosis not present

## 2020-04-21 DIAGNOSIS — M5412 Radiculopathy, cervical region: Secondary | ICD-10-CM | POA: Diagnosis not present

## 2020-04-21 DIAGNOSIS — G43C0 Periodic headache syndromes in child or adult, not intractable: Secondary | ICD-10-CM | POA: Diagnosis not present

## 2020-04-21 DIAGNOSIS — M5411 Radiculopathy, occipito-atlanto-axial region: Secondary | ICD-10-CM | POA: Diagnosis not present

## 2020-04-22 DIAGNOSIS — M531 Cervicobrachial syndrome: Secondary | ICD-10-CM | POA: Diagnosis not present

## 2020-04-22 DIAGNOSIS — M5411 Radiculopathy, occipito-atlanto-axial region: Secondary | ICD-10-CM | POA: Diagnosis not present

## 2020-04-22 DIAGNOSIS — G43C0 Periodic headache syndromes in child or adult, not intractable: Secondary | ICD-10-CM | POA: Diagnosis not present

## 2020-04-22 DIAGNOSIS — M5412 Radiculopathy, cervical region: Secondary | ICD-10-CM | POA: Diagnosis not present

## 2020-04-26 DIAGNOSIS — M5411 Radiculopathy, occipito-atlanto-axial region: Secondary | ICD-10-CM | POA: Diagnosis not present

## 2020-04-26 DIAGNOSIS — M531 Cervicobrachial syndrome: Secondary | ICD-10-CM | POA: Diagnosis not present

## 2020-04-26 DIAGNOSIS — G43C0 Periodic headache syndromes in child or adult, not intractable: Secondary | ICD-10-CM | POA: Diagnosis not present

## 2020-04-26 DIAGNOSIS — M5412 Radiculopathy, cervical region: Secondary | ICD-10-CM | POA: Diagnosis not present

## 2020-04-28 DIAGNOSIS — M531 Cervicobrachial syndrome: Secondary | ICD-10-CM | POA: Diagnosis not present

## 2020-04-28 DIAGNOSIS — G43C0 Periodic headache syndromes in child or adult, not intractable: Secondary | ICD-10-CM | POA: Diagnosis not present

## 2020-04-28 DIAGNOSIS — M5411 Radiculopathy, occipito-atlanto-axial region: Secondary | ICD-10-CM | POA: Diagnosis not present

## 2020-04-28 DIAGNOSIS — M5412 Radiculopathy, cervical region: Secondary | ICD-10-CM | POA: Diagnosis not present

## 2020-04-29 DIAGNOSIS — M5412 Radiculopathy, cervical region: Secondary | ICD-10-CM | POA: Diagnosis not present

## 2020-04-29 DIAGNOSIS — M5411 Radiculopathy, occipito-atlanto-axial region: Secondary | ICD-10-CM | POA: Diagnosis not present

## 2020-04-29 DIAGNOSIS — G43C0 Periodic headache syndromes in child or adult, not intractable: Secondary | ICD-10-CM | POA: Diagnosis not present

## 2020-04-29 DIAGNOSIS — M531 Cervicobrachial syndrome: Secondary | ICD-10-CM | POA: Diagnosis not present

## 2020-05-15 DIAGNOSIS — D509 Iron deficiency anemia, unspecified: Secondary | ICD-10-CM | POA: Diagnosis not present

## 2020-05-15 DIAGNOSIS — L03119 Cellulitis of unspecified part of limb: Secondary | ICD-10-CM | POA: Diagnosis not present

## 2020-05-15 DIAGNOSIS — M7731 Calcaneal spur, right foot: Secondary | ICD-10-CM | POA: Diagnosis not present

## 2020-05-15 DIAGNOSIS — E669 Obesity, unspecified: Secondary | ICD-10-CM | POA: Diagnosis not present

## 2020-05-15 DIAGNOSIS — I96 Gangrene, not elsewhere classified: Secondary | ICD-10-CM | POA: Diagnosis not present

## 2020-05-15 DIAGNOSIS — L03032 Cellulitis of left toe: Secondary | ICD-10-CM | POA: Diagnosis not present

## 2020-05-15 DIAGNOSIS — E1159 Type 2 diabetes mellitus with other circulatory complications: Secondary | ICD-10-CM | POA: Insufficient documentation

## 2020-05-15 DIAGNOSIS — M25475 Effusion, left foot: Secondary | ICD-10-CM | POA: Diagnosis not present

## 2020-05-15 DIAGNOSIS — E11622 Type 2 diabetes mellitus with other skin ulcer: Secondary | ICD-10-CM | POA: Diagnosis not present

## 2020-05-15 DIAGNOSIS — E1169 Type 2 diabetes mellitus with other specified complication: Secondary | ICD-10-CM | POA: Diagnosis not present

## 2020-05-15 DIAGNOSIS — M2012 Hallux valgus (acquired), left foot: Secondary | ICD-10-CM | POA: Diagnosis not present

## 2020-05-15 DIAGNOSIS — E11621 Type 2 diabetes mellitus with foot ulcer: Secondary | ICD-10-CM | POA: Diagnosis not present

## 2020-05-15 DIAGNOSIS — Z6836 Body mass index (BMI) 36.0-36.9, adult: Secondary | ICD-10-CM | POA: Diagnosis not present

## 2020-05-15 DIAGNOSIS — A419 Sepsis, unspecified organism: Secondary | ICD-10-CM | POA: Diagnosis not present

## 2020-05-15 DIAGNOSIS — S91301A Unspecified open wound, right foot, initial encounter: Secondary | ICD-10-CM | POA: Diagnosis not present

## 2020-05-15 DIAGNOSIS — L03116 Cellulitis of left lower limb: Secondary | ICD-10-CM | POA: Diagnosis not present

## 2020-05-15 DIAGNOSIS — L97524 Non-pressure chronic ulcer of other part of left foot with necrosis of bone: Secondary | ICD-10-CM | POA: Diagnosis not present

## 2020-05-15 DIAGNOSIS — L97529 Non-pressure chronic ulcer of other part of left foot with unspecified severity: Secondary | ICD-10-CM | POA: Diagnosis not present

## 2020-05-15 DIAGNOSIS — L97509 Non-pressure chronic ulcer of other part of unspecified foot with unspecified severity: Secondary | ICD-10-CM | POA: Diagnosis not present

## 2020-05-15 DIAGNOSIS — Z981 Arthrodesis status: Secondary | ICD-10-CM | POA: Diagnosis not present

## 2020-05-15 DIAGNOSIS — M869 Osteomyelitis, unspecified: Secondary | ICD-10-CM | POA: Diagnosis not present

## 2020-05-15 DIAGNOSIS — L97519 Non-pressure chronic ulcer of other part of right foot with unspecified severity: Secondary | ICD-10-CM | POA: Diagnosis not present

## 2020-05-15 DIAGNOSIS — M868X7 Other osteomyelitis, ankle and foot: Secondary | ICD-10-CM | POA: Diagnosis not present

## 2020-05-15 DIAGNOSIS — Z7984 Long term (current) use of oral hypoglycemic drugs: Secondary | ICD-10-CM | POA: Diagnosis not present

## 2020-05-15 DIAGNOSIS — Z20822 Contact with and (suspected) exposure to covid-19: Secondary | ICD-10-CM | POA: Diagnosis not present

## 2020-05-15 DIAGNOSIS — E871 Hypo-osmolality and hyponatremia: Secondary | ICD-10-CM | POA: Diagnosis not present

## 2020-05-15 DIAGNOSIS — T502X5A Adverse effect of carbonic-anhydrase inhibitors, benzothiadiazides and other diuretics, initial encounter: Secondary | ICD-10-CM | POA: Diagnosis not present

## 2020-05-15 DIAGNOSIS — I10 Essential (primary) hypertension: Secondary | ICD-10-CM | POA: Diagnosis not present

## 2020-05-15 DIAGNOSIS — M7989 Other specified soft tissue disorders: Secondary | ICD-10-CM | POA: Diagnosis not present

## 2020-05-15 DIAGNOSIS — M7732 Calcaneal spur, left foot: Secondary | ICD-10-CM | POA: Diagnosis not present

## 2020-05-15 DIAGNOSIS — Z89422 Acquired absence of other left toe(s): Secondary | ICD-10-CM | POA: Diagnosis not present

## 2020-05-15 DIAGNOSIS — E1152 Type 2 diabetes mellitus with diabetic peripheral angiopathy with gangrene: Secondary | ICD-10-CM | POA: Diagnosis not present

## 2020-05-20 DIAGNOSIS — L03119 Cellulitis of unspecified part of limb: Secondary | ICD-10-CM | POA: Insufficient documentation

## 2020-05-23 DIAGNOSIS — L03116 Cellulitis of left lower limb: Secondary | ICD-10-CM | POA: Diagnosis not present

## 2020-05-23 DIAGNOSIS — I96 Gangrene, not elsewhere classified: Secondary | ICD-10-CM | POA: Diagnosis not present

## 2020-05-23 DIAGNOSIS — E1169 Type 2 diabetes mellitus with other specified complication: Secondary | ICD-10-CM | POA: Diagnosis not present

## 2020-05-25 DIAGNOSIS — R110 Nausea: Secondary | ICD-10-CM | POA: Diagnosis not present

## 2020-05-25 DIAGNOSIS — Z09 Encounter for follow-up examination after completed treatment for conditions other than malignant neoplasm: Secondary | ICD-10-CM | POA: Diagnosis not present

## 2020-05-25 DIAGNOSIS — S98132A Complete traumatic amputation of one left lesser toe, initial encounter: Secondary | ICD-10-CM | POA: Diagnosis not present

## 2020-05-28 IMAGING — US US BREAST*L* LIMITED INC AXILLA
1 series · 16 of 18 positions shown · non-contrast
Comparison: Baseline exam
COMPARISON: Baseline exam

Addendum:
CLINICAL DATA: Patient describes spontaneous clear single duct
discharge from the LEFT nipple.

EXAM:
DIGITAL DIAGNOSTIC BILATERAL MAMMOGRAM WITH CAD AND TOMO
ULTRASOUND LEFT BREAST

[Series 1: us breast*left* limited inc axilla · 0.07mm/px · 16 of 18 slices shown]
[im 1/18]
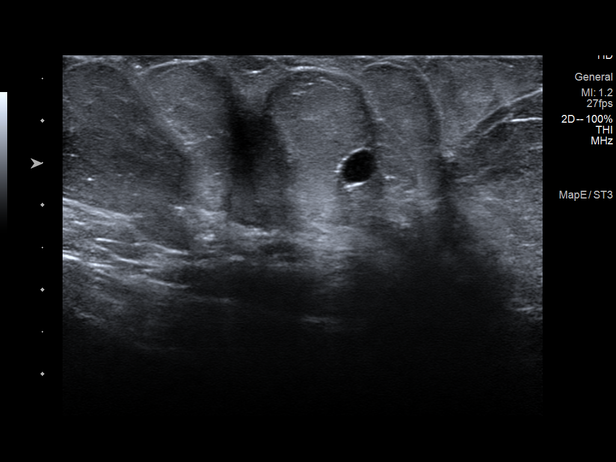
[im 2/18]
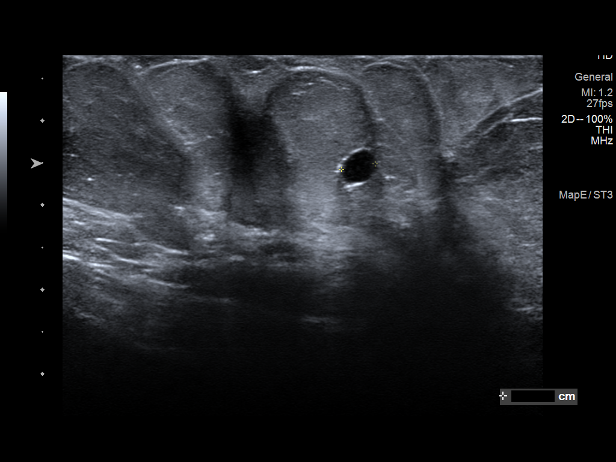
[im 3/18]
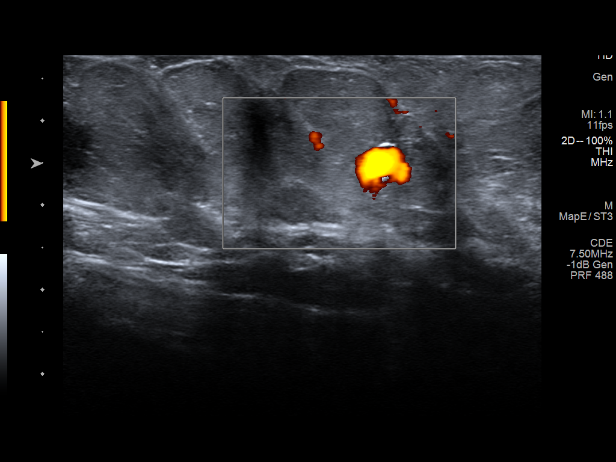
[im 4/18]
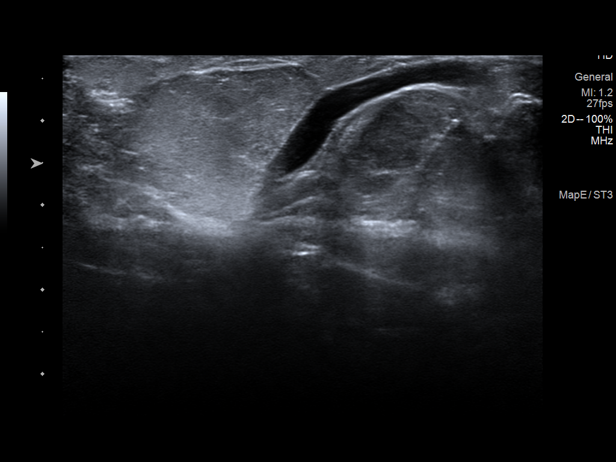
[im 6/18]
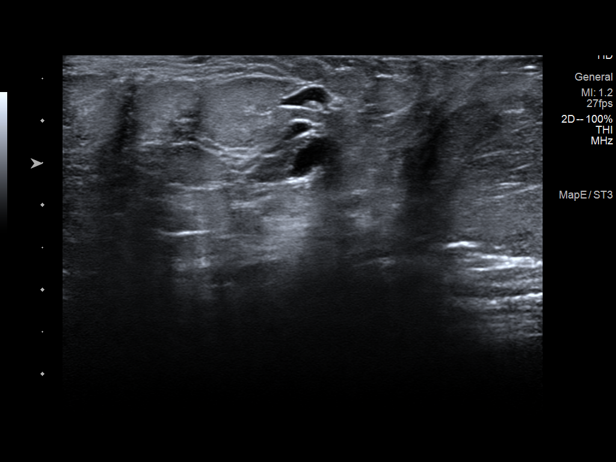
[im 7/18]
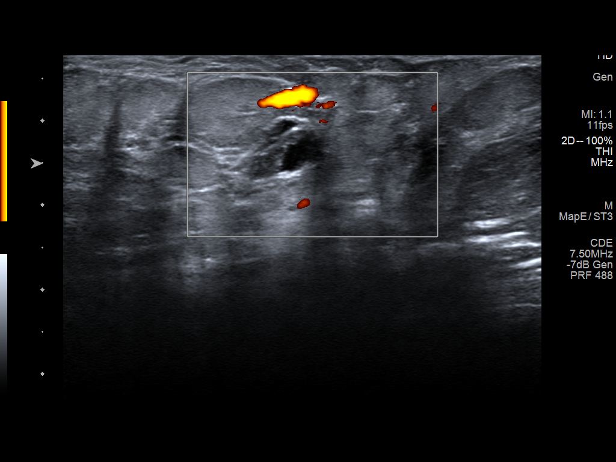
[im 8/18]
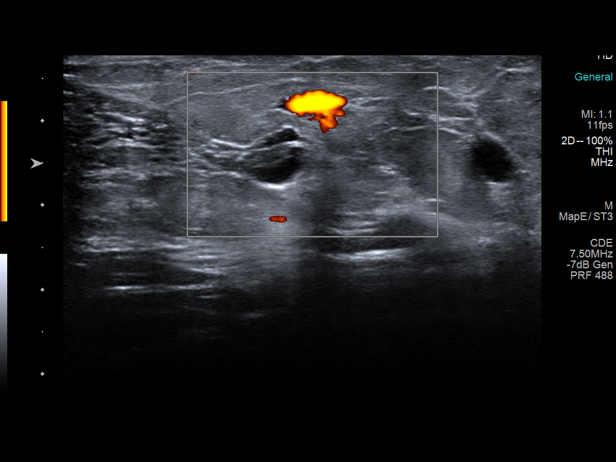
[im 9/18]
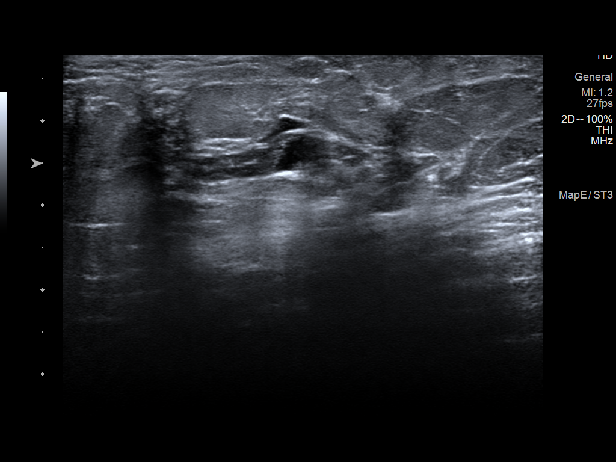
[im 10/18]
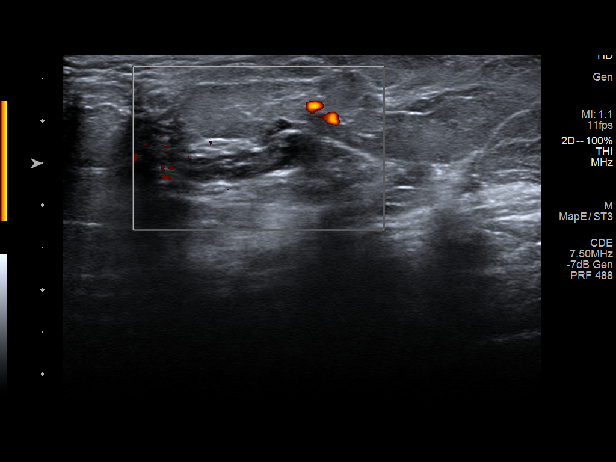
[im 11/18]
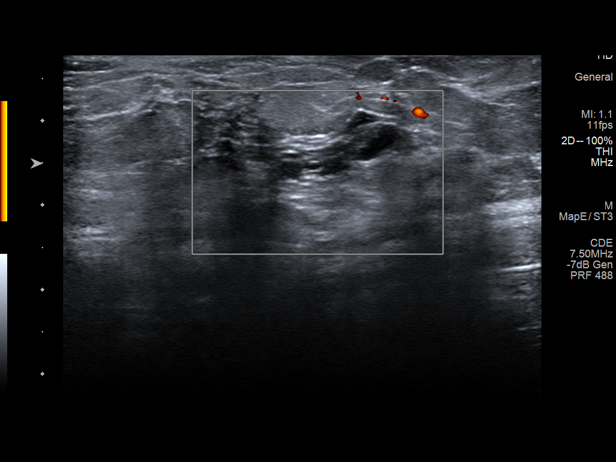
[im 12/18]
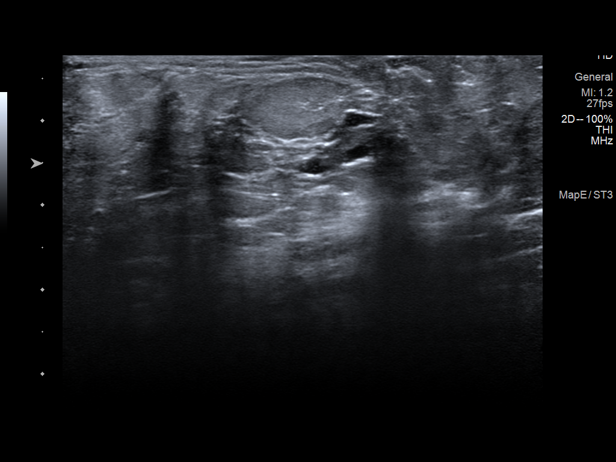
[im 13/18]
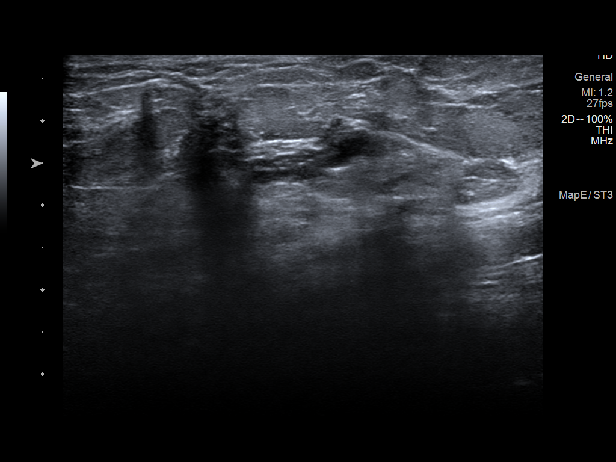
[im 15/18]
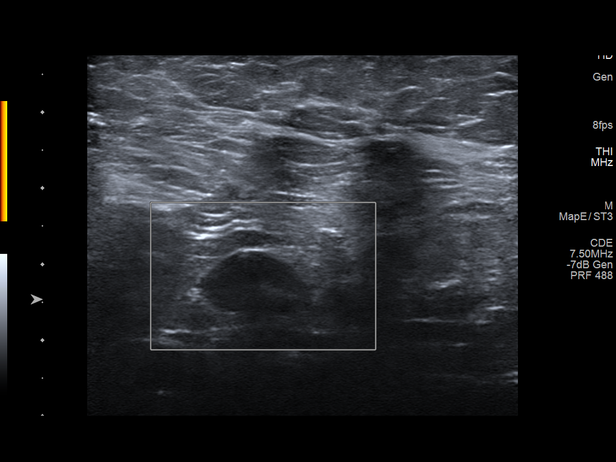
[im 16/18]
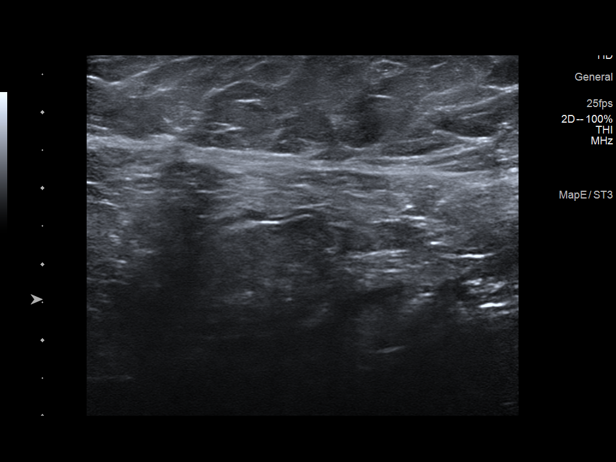
[im 17/18]
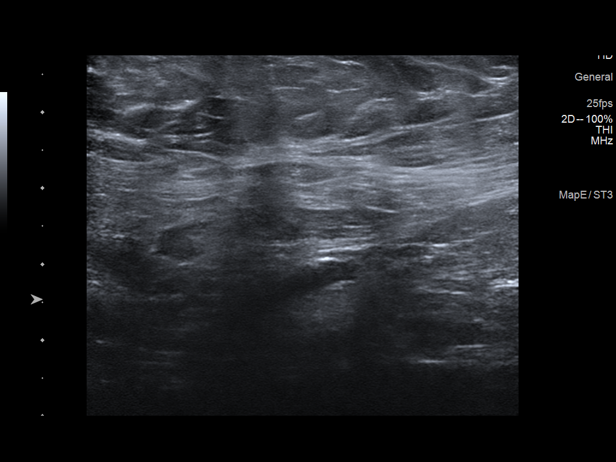
[im 18/18]
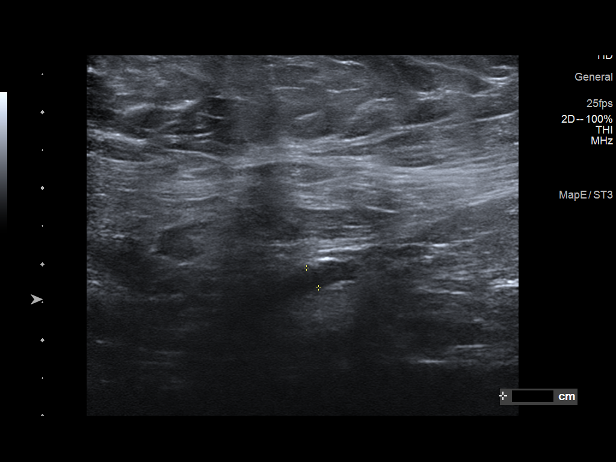

[16 of 18 positions shown; findings below may reference images not displayed]

ACR Breast Density Category c: The breast tissue is heterogeneously
dense, which may obscure small masses.
FINDINGS: Within the LOWER OUTER QUADRANT of the LEFT breast, a circumscribed
oval mass is confirmed on spot compression views. RIGHT breast is
negative.

Mammographic images were processed with CAD.

On physical exam, I am not able to express discharge from the LEFT
nipple today. The appearance of the LEFT nipple is unremarkable. I
palpate no mass in the LOWER OUTER QUADRANT of the LEFT breast.

Targeted ultrasound is performed, showing a focally ectatic duct
with internal hypoechoic material, consistent with debris or
avascular mass. This area measures approximately 1.6 centimeters in
length. Otherwise, evaluation of the LATERAL portion of the LEFT
breast is unremarkable.

Evaluation of the LEFT axilla shows single mildly prominent lymph
node with cortical thickening of 4 millimeters. Other axillary lymph
nodes have normal morphology. Patient recently had her second
Dovid-RD vaccine in the LEFT arm.
IMPRESSION: 1. Spontaneous single duct discharge warranting further evaluation.
2. Possible intraductal mass in the 4 o'clock location of the LOWER
breast 3 centimeters from the nipple warranting tissue diagnosis.
3. Single prominent LEFT axillary lymph node may be reactive related
to patient's recent Dovid-RD vaccine.

RECOMMENDATION:
1. Ultrasound-guided core biopsy of intraductal mass in the 4
o'clock location of the LEFT breast 3 centimeters from the nipple.
2. Surgical consultation regarding LEFT nipple discharge.
3. Bilateral breast MRI to evaluate LEFT nipple discharge.
4. Recommend short interval follow-up of LEFT axillary lymph node
with LEFT axillary ultrasound in 3 months if there is no at other
evidence for malignancy. If there is biopsy proven evidence for
malignancy in the LEFT breast, recommend ultrasound-guided core
biopsy of LEFT axillary lymph node.

I have discussed the findings and recommendations with the patient.
If applicable, a reminder letter will be sent to the patient
regarding the next appointment.

BI-RADS CATEGORY  4: Suspicious.

ADDENDUM:
Patient is scheduled for core biopsy of possible intraductal mass in
the LEFT breast on 12/12/2019.

Surgical consultation has been arranged with Dr. Mahsa Kanneh
at [REDACTED] on December 23, 2019.

Saba Profit RN on 12/08/2019.

*** End of Addendum ***
ACR Breast Density Category c: The breast tissue is heterogeneously
dense, which may obscure small masses.
FINDINGS: Within the LOWER OUTER QUADRANT of the LEFT breast, a circumscribed
oval mass is confirmed on spot compression views. RIGHT breast is
negative.

Mammographic images were processed with CAD.

On physical exam, I am not able to express discharge from the LEFT
nipple today. The appearance of the LEFT nipple is unremarkable. I
palpate no mass in the LOWER OUTER QUADRANT of the LEFT breast.

Targeted ultrasound is performed, showing a focally ectatic duct
with internal hypoechoic material, consistent with debris or
avascular mass. This area measures approximately 1.6 centimeters in
length. Otherwise, evaluation of the LATERAL portion of the LEFT
breast is unremarkable.

Evaluation of the LEFT axilla shows single mildly prominent lymph
node with cortical thickening of 4 millimeters. Other axillary lymph
nodes have normal morphology. Patient recently had her second
Dovid-RD vaccine in the LEFT arm.
IMPRESSION: 1. Spontaneous single duct discharge warranting further evaluation.
2. Possible intraductal mass in the 4 o'clock location of the LOWER
breast 3 centimeters from the nipple warranting tissue diagnosis.
3. Single prominent LEFT axillary lymph node may be reactive related
to patient's recent Dovid-RD vaccine.

RECOMMENDATION:
1. Ultrasound-guided core biopsy of intraductal mass in the 4
o'clock location of the LEFT breast 3 centimeters from the nipple.
2. Surgical consultation regarding LEFT nipple discharge.
3. Bilateral breast MRI to evaluate LEFT nipple discharge.
4. Recommend short interval follow-up of LEFT axillary lymph node
with LEFT axillary ultrasound in 3 months if there is no at other
evidence for malignancy. If there is biopsy proven evidence for
malignancy in the LEFT breast, recommend ultrasound-guided core
biopsy of LEFT axillary lymph node.

I have discussed the findings and recommendations with the patient.
If applicable, a reminder letter will be sent to the patient
regarding the next appointment.

BI-RADS CATEGORY  4: Suspicious.

## 2020-05-28 IMAGING — MG DIGITAL DIAGNOSTIC BILAT W/ TOMO W/ CAD
7 series · 7 of 23 positions shown · non-contrast
Comparison: Baseline exam
COMPARISON: Baseline exam

Addendum:
CLINICAL DATA: Patient describes spontaneous clear single duct
discharge from the LEFT nipple.

EXAM:
DIGITAL DIAGNOSTIC BILATERAL MAMMOGRAM WITH CAD AND TOMO
ULTRASOUND LEFT BREAST

[L MLO synth-2D (1 of 2)]
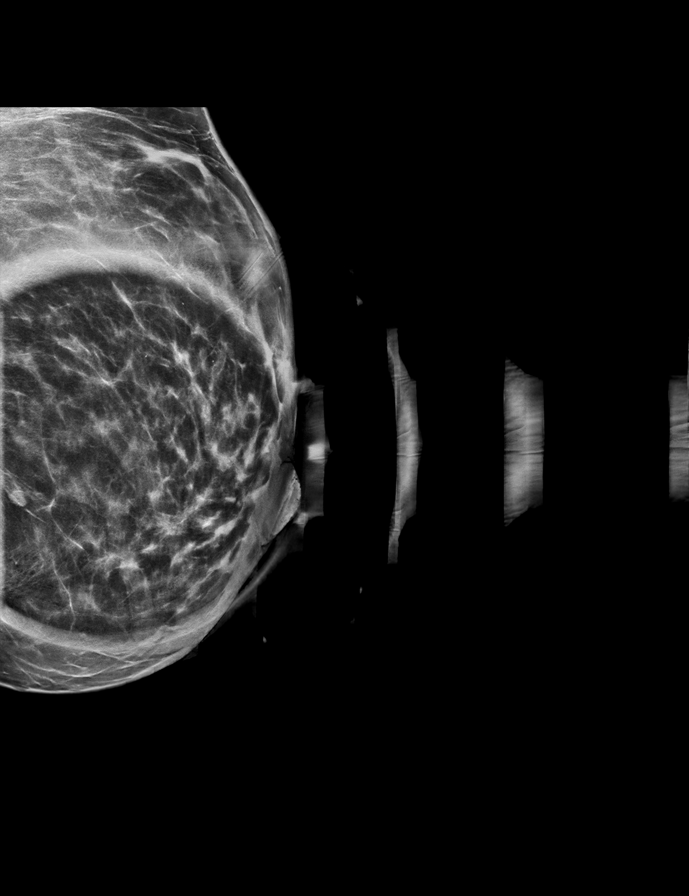

[L MLO synth-2D (2 of 2)]
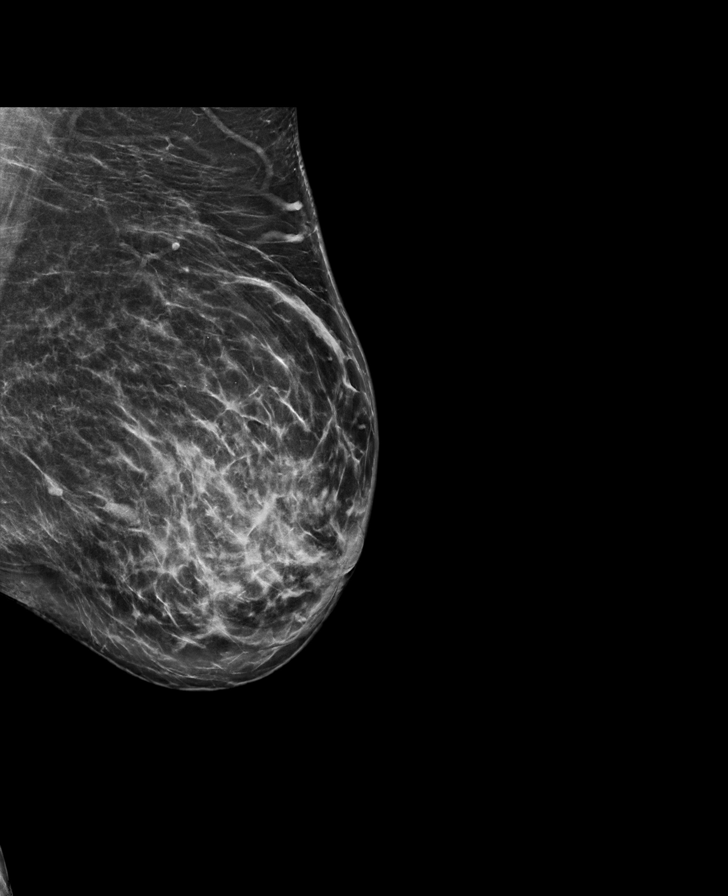

[R CC synth-2D]
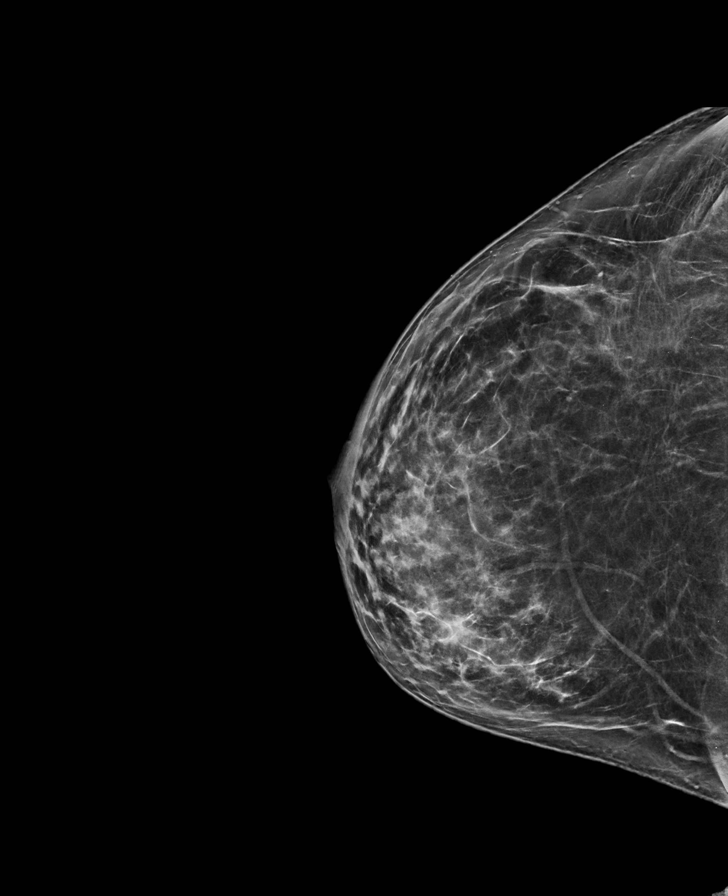

[L MLO tomo (1 of 2) · tomo slice 39/77.0]
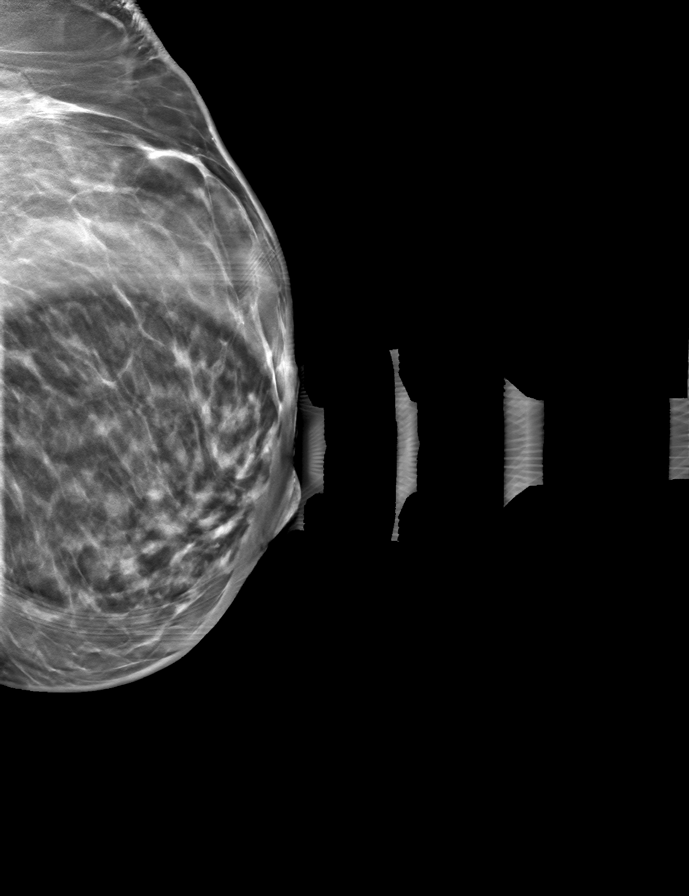

[L MLO tomo (2 of 2) · tomo slice 47/93.0]
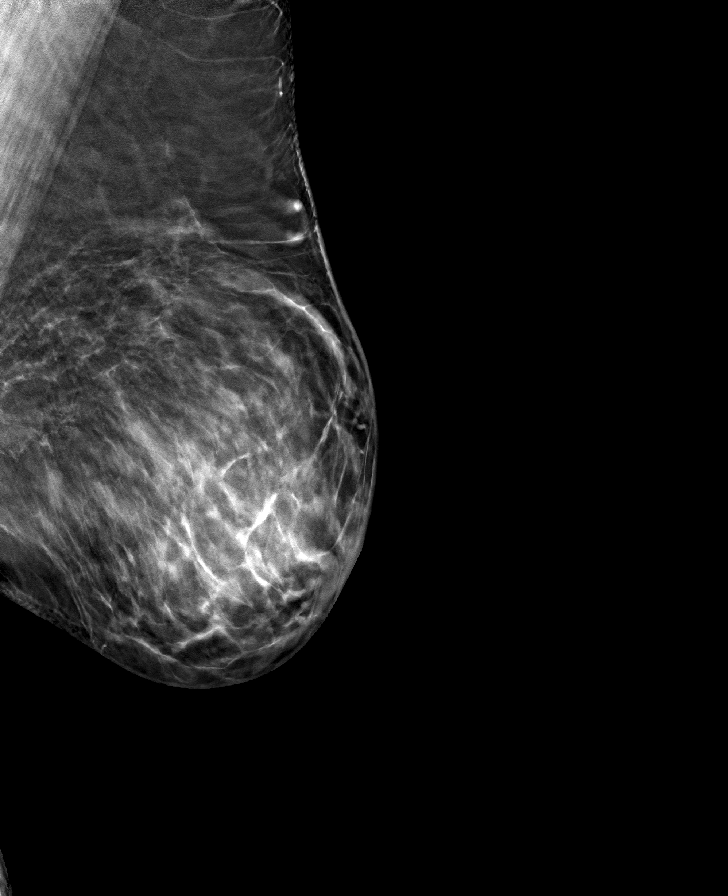

[R CC tomo · tomo slice 42/83.0]
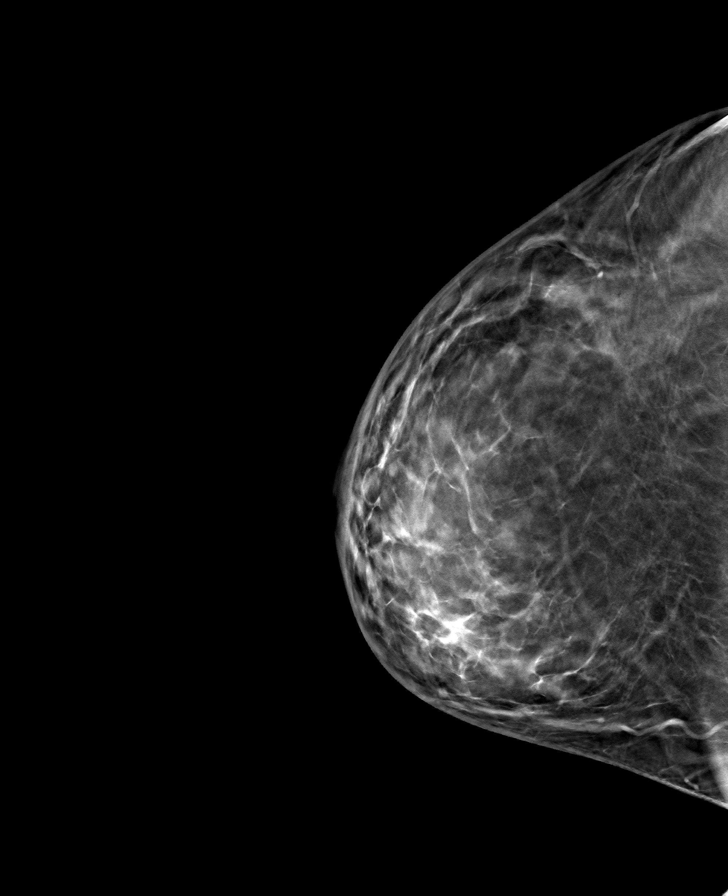

[R MLO tomo · tomo slice 45/89.0]
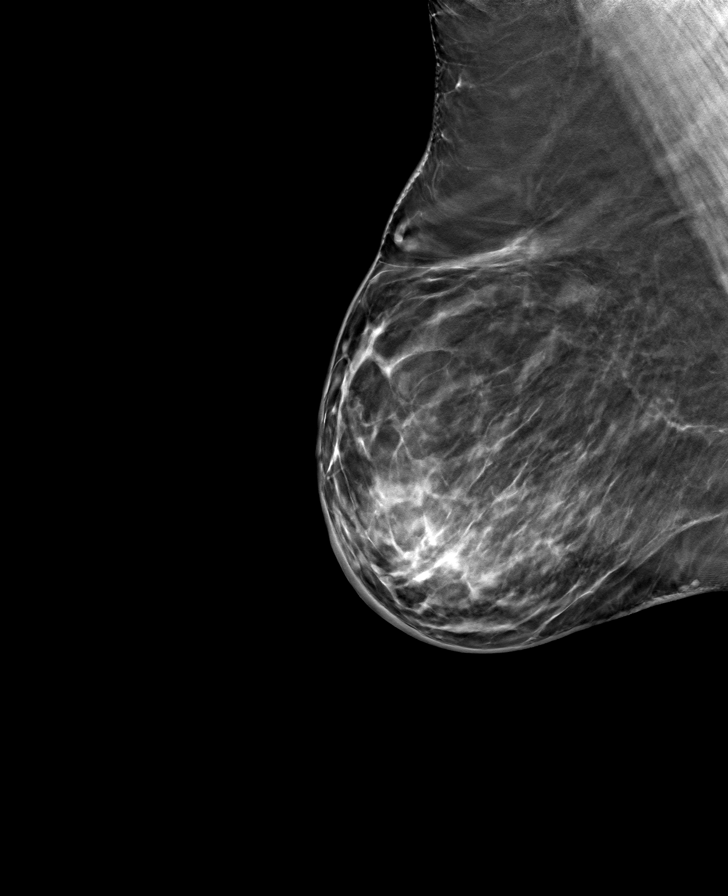

[7 of 23 positions shown; findings below may reference images not displayed]

ACR Breast Density Category c: The breast tissue is heterogeneously
dense, which may obscure small masses.
FINDINGS: Within the LOWER OUTER QUADRANT of the LEFT breast, a circumscribed
oval mass is confirmed on spot compression views. RIGHT breast is
negative.

Mammographic images were processed with CAD.

On physical exam, I am not able to express discharge from the LEFT
nipple today. The appearance of the LEFT nipple is unremarkable. I
palpate no mass in the LOWER OUTER QUADRANT of the LEFT breast.

Targeted ultrasound is performed, showing a focally ectatic duct
with internal hypoechoic material, consistent with debris or
avascular mass. This area measures approximately 1.6 centimeters in
length. Otherwise, evaluation of the LATERAL portion of the LEFT
breast is unremarkable.

Evaluation of the LEFT axilla shows single mildly prominent lymph
node with cortical thickening of 4 millimeters. Other axillary lymph
nodes have normal morphology. Patient recently had her second
Dovid-RD vaccine in the LEFT arm.
IMPRESSION: 1. Spontaneous single duct discharge warranting further evaluation.
2. Possible intraductal mass in the 4 o'clock location of the LOWER
breast 3 centimeters from the nipple warranting tissue diagnosis.
3. Single prominent LEFT axillary lymph node may be reactive related
to patient's recent Dovid-RD vaccine.

RECOMMENDATION:
1. Ultrasound-guided core biopsy of intraductal mass in the 4
o'clock location of the LEFT breast 3 centimeters from the nipple.
2. Surgical consultation regarding LEFT nipple discharge.
3. Bilateral breast MRI to evaluate LEFT nipple discharge.
4. Recommend short interval follow-up of LEFT axillary lymph node
with LEFT axillary ultrasound in 3 months if there is no at other
evidence for malignancy. If there is biopsy proven evidence for
malignancy in the LEFT breast, recommend ultrasound-guided core
biopsy of LEFT axillary lymph node.

I have discussed the findings and recommendations with the patient.
If applicable, a reminder letter will be sent to the patient
regarding the next appointment.

BI-RADS CATEGORY  4: Suspicious.

ADDENDUM:
Patient is scheduled for core biopsy of possible intraductal mass in
the LEFT breast on 12/12/2019.

Surgical consultation has been arranged with Dr. Mahsa Kanneh
at [REDACTED] on December 23, 2019.

Saba Profit RN on 12/08/2019.

*** End of Addendum ***
ACR Breast Density Category c: The breast tissue is heterogeneously
dense, which may obscure small masses.
FINDINGS: Within the LOWER OUTER QUADRANT of the LEFT breast, a circumscribed
oval mass is confirmed on spot compression views. RIGHT breast is
negative.

Mammographic images were processed with CAD.

On physical exam, I am not able to express discharge from the LEFT
nipple today. The appearance of the LEFT nipple is unremarkable. I
palpate no mass in the LOWER OUTER QUADRANT of the LEFT breast.

Targeted ultrasound is performed, showing a focally ectatic duct
with internal hypoechoic material, consistent with debris or
avascular mass. This area measures approximately 1.6 centimeters in
length. Otherwise, evaluation of the LATERAL portion of the LEFT
breast is unremarkable.

Evaluation of the LEFT axilla shows single mildly prominent lymph
node with cortical thickening of 4 millimeters. Other axillary lymph
nodes have normal morphology. Patient recently had her second
Dovid-RD vaccine in the LEFT arm.
IMPRESSION: 1. Spontaneous single duct discharge warranting further evaluation.
2. Possible intraductal mass in the 4 o'clock location of the LOWER
breast 3 centimeters from the nipple warranting tissue diagnosis.
3. Single prominent LEFT axillary lymph node may be reactive related
to patient's recent Dovid-RD vaccine.

RECOMMENDATION:
1. Ultrasound-guided core biopsy of intraductal mass in the 4
o'clock location of the LEFT breast 3 centimeters from the nipple.
2. Surgical consultation regarding LEFT nipple discharge.
3. Bilateral breast MRI to evaluate LEFT nipple discharge.
4. Recommend short interval follow-up of LEFT axillary lymph node
with LEFT axillary ultrasound in 3 months if there is no at other
evidence for malignancy. If there is biopsy proven evidence for
malignancy in the LEFT breast, recommend ultrasound-guided core
biopsy of LEFT axillary lymph node.

I have discussed the findings and recommendations with the patient.
If applicable, a reminder letter will be sent to the patient
regarding the next appointment.

BI-RADS CATEGORY  4: Suspicious.

## 2020-05-30 DIAGNOSIS — E1169 Type 2 diabetes mellitus with other specified complication: Secondary | ICD-10-CM | POA: Diagnosis not present

## 2020-05-30 DIAGNOSIS — I96 Gangrene, not elsewhere classified: Secondary | ICD-10-CM | POA: Diagnosis not present

## 2020-05-30 DIAGNOSIS — L03116 Cellulitis of left lower limb: Secondary | ICD-10-CM | POA: Diagnosis not present

## 2020-06-03 DIAGNOSIS — Z89422 Acquired absence of other left toe(s): Secondary | ICD-10-CM | POA: Insufficient documentation

## 2020-06-03 DIAGNOSIS — L97512 Non-pressure chronic ulcer of other part of right foot with fat layer exposed: Secondary | ICD-10-CM | POA: Diagnosis not present

## 2020-06-03 DIAGNOSIS — E1140 Type 2 diabetes mellitus with diabetic neuropathy, unspecified: Secondary | ICD-10-CM | POA: Diagnosis not present

## 2020-06-04 DIAGNOSIS — L97509 Non-pressure chronic ulcer of other part of unspecified foot with unspecified severity: Secondary | ICD-10-CM | POA: Diagnosis not present

## 2020-06-06 DIAGNOSIS — E1169 Type 2 diabetes mellitus with other specified complication: Secondary | ICD-10-CM | POA: Diagnosis not present

## 2020-06-06 DIAGNOSIS — L03116 Cellulitis of left lower limb: Secondary | ICD-10-CM | POA: Diagnosis not present

## 2020-06-06 DIAGNOSIS — I96 Gangrene, not elsewhere classified: Secondary | ICD-10-CM | POA: Diagnosis not present

## 2020-06-13 DIAGNOSIS — I96 Gangrene, not elsewhere classified: Secondary | ICD-10-CM | POA: Diagnosis not present

## 2020-06-13 DIAGNOSIS — E1169 Type 2 diabetes mellitus with other specified complication: Secondary | ICD-10-CM | POA: Diagnosis not present

## 2020-06-13 DIAGNOSIS — L03116 Cellulitis of left lower limb: Secondary | ICD-10-CM | POA: Diagnosis not present

## 2020-06-17 DIAGNOSIS — Z23 Encounter for immunization: Secondary | ICD-10-CM | POA: Diagnosis not present

## 2020-06-17 DIAGNOSIS — Z792 Long term (current) use of antibiotics: Secondary | ICD-10-CM | POA: Diagnosis not present

## 2020-06-17 DIAGNOSIS — E11621 Type 2 diabetes mellitus with foot ulcer: Secondary | ICD-10-CM | POA: Diagnosis not present

## 2020-06-17 DIAGNOSIS — L97512 Non-pressure chronic ulcer of other part of right foot with fat layer exposed: Secondary | ICD-10-CM | POA: Diagnosis not present

## 2020-06-17 DIAGNOSIS — L97522 Non-pressure chronic ulcer of other part of left foot with fat layer exposed: Secondary | ICD-10-CM | POA: Diagnosis not present

## 2020-06-18 DIAGNOSIS — L97509 Non-pressure chronic ulcer of other part of unspecified foot with unspecified severity: Secondary | ICD-10-CM | POA: Diagnosis not present

## 2020-06-20 DIAGNOSIS — L03116 Cellulitis of left lower limb: Secondary | ICD-10-CM | POA: Diagnosis not present

## 2020-06-20 DIAGNOSIS — E1169 Type 2 diabetes mellitus with other specified complication: Secondary | ICD-10-CM | POA: Diagnosis not present

## 2020-06-20 DIAGNOSIS — I96 Gangrene, not elsewhere classified: Secondary | ICD-10-CM | POA: Diagnosis not present

## 2020-06-25 DIAGNOSIS — E1159 Type 2 diabetes mellitus with other circulatory complications: Secondary | ICD-10-CM | POA: Diagnosis not present

## 2020-06-25 DIAGNOSIS — I152 Hypertension secondary to endocrine disorders: Secondary | ICD-10-CM | POA: Diagnosis not present

## 2020-06-25 DIAGNOSIS — F331 Major depressive disorder, recurrent, moderate: Secondary | ICD-10-CM | POA: Diagnosis not present

## 2020-06-25 DIAGNOSIS — K219 Gastro-esophageal reflux disease without esophagitis: Secondary | ICD-10-CM | POA: Diagnosis not present

## 2020-06-27 DIAGNOSIS — E1169 Type 2 diabetes mellitus with other specified complication: Secondary | ICD-10-CM | POA: Diagnosis not present

## 2020-06-27 DIAGNOSIS — L03116 Cellulitis of left lower limb: Secondary | ICD-10-CM | POA: Diagnosis not present

## 2020-06-27 DIAGNOSIS — I96 Gangrene, not elsewhere classified: Secondary | ICD-10-CM | POA: Diagnosis not present

## 2020-07-01 DIAGNOSIS — L97509 Non-pressure chronic ulcer of other part of unspecified foot with unspecified severity: Secondary | ICD-10-CM | POA: Diagnosis not present

## 2020-07-01 DIAGNOSIS — Z89422 Acquired absence of other left toe(s): Secondary | ICD-10-CM | POA: Diagnosis not present

## 2020-07-01 DIAGNOSIS — L97512 Non-pressure chronic ulcer of other part of right foot with fat layer exposed: Secondary | ICD-10-CM | POA: Diagnosis not present

## 2020-07-01 DIAGNOSIS — L97522 Non-pressure chronic ulcer of other part of left foot with fat layer exposed: Secondary | ICD-10-CM | POA: Diagnosis not present

## 2020-07-07 ENCOUNTER — Other Ambulatory Visit: Payer: Self-pay

## 2020-07-07 ENCOUNTER — Inpatient Hospital Stay: Payer: BC Managed Care – PPO

## 2020-07-07 ENCOUNTER — Inpatient Hospital Stay: Payer: BC Managed Care – PPO | Attending: Internal Medicine | Admitting: Internal Medicine

## 2020-07-07 ENCOUNTER — Encounter: Payer: Self-pay | Admitting: Internal Medicine

## 2020-07-07 VITALS — BP 152/86 | HR 112 | Temp 97.2°F | Resp 20 | Ht 71.0 in | Wt 247.4 lb

## 2020-07-07 DIAGNOSIS — E119 Type 2 diabetes mellitus without complications: Secondary | ICD-10-CM | POA: Insufficient documentation

## 2020-07-07 DIAGNOSIS — N920 Excessive and frequent menstruation with regular cycle: Secondary | ICD-10-CM | POA: Insufficient documentation

## 2020-07-07 DIAGNOSIS — Z79899 Other long term (current) drug therapy: Secondary | ICD-10-CM | POA: Insufficient documentation

## 2020-07-07 DIAGNOSIS — I10 Essential (primary) hypertension: Secondary | ICD-10-CM | POA: Insufficient documentation

## 2020-07-07 DIAGNOSIS — D500 Iron deficiency anemia secondary to blood loss (chronic): Secondary | ICD-10-CM

## 2020-07-07 DIAGNOSIS — D509 Iron deficiency anemia, unspecified: Secondary | ICD-10-CM

## 2020-07-07 LAB — CBC WITH DIFFERENTIAL (CANCER CENTER ONLY)
Abs Immature Granulocytes: 0.05 10*3/uL (ref 0.00–0.07)
Basophils Absolute: 0.1 10*3/uL (ref 0.0–0.1)
Basophils Relative: 1 %
Eosinophils Absolute: 0.1 10*3/uL (ref 0.0–0.5)
Eosinophils Relative: 1 %
HCT: 42.4 % (ref 36.0–46.0)
Hemoglobin: 14.1 g/dL (ref 12.0–15.0)
Immature Granulocytes: 1 %
Lymphocytes Relative: 23 %
Lymphs Abs: 2.4 10*3/uL (ref 0.7–4.0)
MCH: 27.2 pg (ref 26.0–34.0)
MCHC: 33.3 g/dL (ref 30.0–36.0)
MCV: 81.7 fL (ref 80.0–100.0)
Monocytes Absolute: 0.7 10*3/uL (ref 0.1–1.0)
Monocytes Relative: 7 %
Neutro Abs: 6.8 10*3/uL (ref 1.7–7.7)
Neutrophils Relative %: 67 %
Platelet Count: 450 10*3/uL — ABNORMAL HIGH (ref 150–400)
RBC: 5.19 MIL/uL — ABNORMAL HIGH (ref 3.87–5.11)
RDW: 14.6 % (ref 11.5–15.5)
WBC Count: 10.1 10*3/uL (ref 4.0–10.5)
nRBC: 0 % (ref 0.0–0.2)

## 2020-07-07 LAB — FERRITIN: Ferritin: 33 ng/mL (ref 11–307)

## 2020-07-07 LAB — IRON AND TIBC
Iron: 66 ug/dL (ref 41–142)
Saturation Ratios: 20 % — ABNORMAL LOW (ref 21–57)
TIBC: 334 ug/dL (ref 236–444)
UIBC: 268 ug/dL (ref 120–384)

## 2020-07-07 NOTE — Progress Notes (Signed)
[facility] [phone]   Fax:([phone]  OFFICE PROGRESS NOTE  Chloe Hansen, NP [address] [place] [address]  DIAGNOSIS: persistent iron deficiency anemia of unclear etiology except for the potential blood loss with menstruation and dietary deficiency.  PRIOR THERAPY: Iron infusion with Venofer weekly x4 weeks.  CURRENT THERAPY: None  INTERVAL HISTORY: Chloe Morrow 39 y.o. female returns to the clinic today for follow-up visit.  The patient is feeling fine today with no concerning complaints.  She denied having any fatigue or weakness.  She denied having any chest pain, shortness of breath, cough or hemoptysis.  She has no nausea, vomiting, diarrhea or constipation.  She denied having any headache or visual changes.  She has no weight loss or night sweats.  She is here today for evaluation with repeat CBC, iron study and ferritin.  MEDICAL HISTORY: Past Medical History:  Diagnosis Date  . Diabetes mellitus without complication (HCC)   . 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 Hypertension   . Osteomyelitis (HCC)     ALLERGIES:  is allergic to penicillins and sulfa antibiotics.  MEDICATIONS:  Current Outpatient Medications  Medication Sig Dispense Refill  . ADDERALL XR 20 MG 24 hr capsule Take 1 capsule by mouth daily.    Marland Kitchen amLODipine (NORVASC) 5 MG tablet TAKE 1 TABLET(5 MG) BY MOUTH DAILY    . glyBURIDE-metformin (GLUCOVANCE) 5-500 MG tablet Take by mouth.    Marland Kitchen lisinopril-hydrochlorothiazide (ZESTORETIC) 20-12.5 MG tablet Take 1 tablet by mouth 2 (two) times daily.     . pantoprazole (PROTONIX) 40 MG tablet Take 40 mg by mouth daily.    . traZODone (DESYREL) 50 MG tablet Take 50 mg by mouth at bedtime.     No current facility-administered medications for this visit.    SURGICAL HISTORY: No past surgical history on file.  REVIEW OF SYSTEMS:  A comprehensive review of systems was negative.    PHYSICAL EXAMINATION: General appearance: alert, cooperative and no distress Head: Normocephalic, without obvious abnormality, atraumatic Neck: no adenopathy, no JVD, supple, symmetrical, trachea midline and thyroid not enlarged, symmetric, no tenderness/mass/nodules Lymph nodes: Cervical, supraclavicular, and axillary nodes normal. Resp: clear to auscultation bilaterally Back: symmetric, no curvature. ROM normal. No CVA tenderness. Cardio: regular rate and rhythm, S1, S2 normal, no murmur, click, rub or gallop GI: soft, non-tender; bowel sounds normal; no masses,  no organomegaly Extremities: extremities normal, atraumatic, no cyanosis or edema  ECOG PERFORMANCE STATUS: 1 - Symptomatic but completely ambulatory  Blood pressure (!) 152/86, pulse (!) 112, temperature (!) 97.2 F (36.2 C), temperature source Tympanic, resp. rate 20, height 5\' 11"  (1.803 m), weight 247 lb 6.4 oz (112.2 kg), SpO2 99 %.  LABORATORY DATA: Lab Results  Component Value Date   WBC 10.1 07/07/2020   HGB 14.1 07/07/2020   HCT 42.4 07/07/2020   MCV 81.7 07/07/2020   PLT 450 (H) 07/07/2020      Chemistry      Component Value Date/Time   NA 138 06/02/2019 1441   K 3.6 06/02/2019 1441   CL 105 06/02/2019 1441   CO2 22 06/02/2019 1441   BUN 11 06/02/2019 1441   CREATININE 0.83 06/02/2019 1441      Component Value Date/Time   CALCIUM 8.7 (L) 06/02/2019 1441   ALKPHOS 83 06/02/2019 1441   AST 22 06/02/2019 1441   ALT 32 06/02/2019 1441   BILITOT 0.3 06/02/2019 1441  RADIOGRAPHIC STUDIES: No results found.  ASSESSMENT AND PLAN: This is a very pleasant 39 years old white female with persistent iron deficiency anemia secondary to blood loss from menorrhagia and dietary deficiency.  The patient is status post iron infusion with Venofer weekly for 4 weeks. The patient is doing fine today with no concerning complaints. Repeat CBC today showed no concerning anemia but the iron study and ferritin are  still pending. I recommended for the patient to continue with the [place] iron supplements for now. I will see her back for follow-up visit in 3 months for evaluation with repeat CBC, iron study and ferritin unless her iron study today showed significant iron deficiency, I may arrange for the patient to receive iron infusion in the interval. She was advised to call immediately if she has any other concerning symptoms. The patient voices understanding of current disease status and treatment options and is in agreement with the current care plan.  All questions were answered. The patient knows to call the clinic with any problems, questions or concerns. We can certainly see the patient much sooner if necessary.  Disclaimer: This note was dictated with voice recognition software. Similar sounding words can inadvertently be transcribed and may not be corrected upon review.

## 2020-07-09 ENCOUNTER — Telehealth: Payer: Self-pay | Admitting: Internal Medicine

## 2020-07-09 NOTE — Telephone Encounter (Signed)
Scheduled per los. Called and spoke with patient. Confirmed appt 

## 2020-07-22 DIAGNOSIS — L97512 Non-pressure chronic ulcer of other part of right foot with fat layer exposed: Secondary | ICD-10-CM | POA: Diagnosis not present

## 2020-07-22 DIAGNOSIS — L97522 Non-pressure chronic ulcer of other part of left foot with fat layer exposed: Secondary | ICD-10-CM | POA: Diagnosis not present

## 2020-07-22 DIAGNOSIS — Z89422 Acquired absence of other left toe(s): Secondary | ICD-10-CM | POA: Diagnosis not present

## 2020-07-26 DIAGNOSIS — F331 Major depressive disorder, recurrent, moderate: Secondary | ICD-10-CM | POA: Diagnosis not present

## 2020-08-12 DIAGNOSIS — Z89422 Acquired absence of other left toe(s): Secondary | ICD-10-CM | POA: Diagnosis not present

## 2020-08-12 DIAGNOSIS — L97522 Non-pressure chronic ulcer of other part of left foot with fat layer exposed: Secondary | ICD-10-CM | POA: Diagnosis not present

## 2020-08-12 DIAGNOSIS — L97511 Non-pressure chronic ulcer of other part of right foot limited to breakdown of skin: Secondary | ICD-10-CM | POA: Diagnosis not present

## 2020-08-12 DIAGNOSIS — L97509 Non-pressure chronic ulcer of other part of unspecified foot with unspecified severity: Secondary | ICD-10-CM | POA: Diagnosis not present

## 2020-09-27 DIAGNOSIS — L97512 Non-pressure chronic ulcer of other part of right foot with fat layer exposed: Secondary | ICD-10-CM | POA: Diagnosis not present

## 2020-09-27 DIAGNOSIS — L97522 Non-pressure chronic ulcer of other part of left foot with fat layer exposed: Secondary | ICD-10-CM | POA: Diagnosis not present

## 2020-10-07 ENCOUNTER — Inpatient Hospital Stay: Payer: BC Managed Care – PPO

## 2020-10-07 ENCOUNTER — Inpatient Hospital Stay: Payer: BC Managed Care – PPO | Admitting: Internal Medicine

## 2020-10-08 ENCOUNTER — Telehealth: Payer: Self-pay | Admitting: Internal Medicine

## 2020-10-08 NOTE — Telephone Encounter (Signed)
Called pt per 2/3 sch msg - no answer. Left message for patient to call back to reschedule appt.   

## 2020-10-25 DIAGNOSIS — E11621 Type 2 diabetes mellitus with foot ulcer: Secondary | ICD-10-CM | POA: Diagnosis not present

## 2020-10-25 DIAGNOSIS — S91101A Unspecified open wound of right great toe without damage to nail, initial encounter: Secondary | ICD-10-CM | POA: Diagnosis not present

## 2020-10-25 DIAGNOSIS — Z89411 Acquired absence of right great toe: Secondary | ICD-10-CM | POA: Diagnosis not present

## 2020-10-25 DIAGNOSIS — E785 Hyperlipidemia, unspecified: Secondary | ICD-10-CM | POA: Diagnosis not present

## 2020-10-25 DIAGNOSIS — E1152 Type 2 diabetes mellitus with diabetic peripheral angiopathy with gangrene: Secondary | ICD-10-CM | POA: Diagnosis not present

## 2020-10-25 DIAGNOSIS — M86171 Other acute osteomyelitis, right ankle and foot: Secondary | ICD-10-CM | POA: Diagnosis not present

## 2020-10-25 DIAGNOSIS — A419 Sepsis, unspecified organism: Secondary | ICD-10-CM | POA: Diagnosis not present

## 2020-10-25 DIAGNOSIS — F411 Generalized anxiety disorder: Secondary | ICD-10-CM | POA: Diagnosis not present

## 2020-10-25 DIAGNOSIS — L97519 Non-pressure chronic ulcer of other part of right foot with unspecified severity: Secondary | ICD-10-CM | POA: Diagnosis not present

## 2020-10-25 DIAGNOSIS — B954 Other streptococcus as the cause of diseases classified elsewhere: Secondary | ICD-10-CM | POA: Diagnosis not present

## 2020-10-25 DIAGNOSIS — M869 Osteomyelitis, unspecified: Secondary | ICD-10-CM | POA: Diagnosis not present

## 2020-10-25 DIAGNOSIS — E876 Hypokalemia: Secondary | ICD-10-CM | POA: Diagnosis not present

## 2020-10-25 DIAGNOSIS — M86471 Chronic osteomyelitis with draining sinus, right ankle and foot: Secondary | ICD-10-CM | POA: Diagnosis not present

## 2020-10-25 DIAGNOSIS — L97516 Non-pressure chronic ulcer of other part of right foot with bone involvement without evidence of necrosis: Secondary | ICD-10-CM | POA: Diagnosis not present

## 2020-10-25 DIAGNOSIS — M00871 Arthritis due to other bacteria, right ankle and foot: Secondary | ICD-10-CM | POA: Diagnosis not present

## 2020-10-25 DIAGNOSIS — Z792 Long term (current) use of antibiotics: Secondary | ICD-10-CM | POA: Diagnosis not present

## 2020-10-25 DIAGNOSIS — L97528 Non-pressure chronic ulcer of other part of left foot with other specified severity: Secondary | ICD-10-CM | POA: Diagnosis not present

## 2020-10-25 DIAGNOSIS — L97522 Non-pressure chronic ulcer of other part of left foot with fat layer exposed: Secondary | ICD-10-CM | POA: Diagnosis not present

## 2020-10-25 DIAGNOSIS — B957 Other staphylococcus as the cause of diseases classified elsewhere: Secondary | ICD-10-CM | POA: Diagnosis not present

## 2020-10-25 DIAGNOSIS — L97518 Non-pressure chronic ulcer of other part of right foot with other specified severity: Secondary | ICD-10-CM | POA: Diagnosis not present

## 2020-10-25 DIAGNOSIS — I10 Essential (primary) hypertension: Secondary | ICD-10-CM | POA: Diagnosis not present

## 2020-10-25 DIAGNOSIS — S98911A Complete traumatic amputation of right foot, level unspecified, initial encounter: Secondary | ICD-10-CM | POA: Diagnosis not present

## 2020-10-25 DIAGNOSIS — M868X7 Other osteomyelitis, ankle and foot: Secondary | ICD-10-CM | POA: Diagnosis not present

## 2020-10-25 DIAGNOSIS — M6510 Other infective (teno)synovitis, unspecified site: Secondary | ICD-10-CM | POA: Diagnosis not present

## 2020-10-25 DIAGNOSIS — Z20822 Contact with and (suspected) exposure to covid-19: Secondary | ICD-10-CM | POA: Diagnosis not present

## 2020-10-25 DIAGNOSIS — S98111A Complete traumatic amputation of right great toe, initial encounter: Secondary | ICD-10-CM | POA: Diagnosis not present

## 2020-10-25 DIAGNOSIS — L03115 Cellulitis of right lower limb: Secondary | ICD-10-CM | POA: Diagnosis not present

## 2020-10-25 DIAGNOSIS — E1142 Type 2 diabetes mellitus with diabetic polyneuropathy: Secondary | ICD-10-CM | POA: Diagnosis not present

## 2020-10-25 DIAGNOSIS — L97512 Non-pressure chronic ulcer of other part of right foot with fat layer exposed: Secondary | ICD-10-CM | POA: Diagnosis not present

## 2020-10-25 DIAGNOSIS — Z1624 Resistance to multiple antibiotics: Secondary | ICD-10-CM | POA: Diagnosis not present

## 2020-10-25 DIAGNOSIS — L03119 Cellulitis of unspecified part of limb: Secondary | ICD-10-CM | POA: Diagnosis not present

## 2020-10-25 DIAGNOSIS — M84477A Pathological fracture, right toe(s), initial encounter for fracture: Secondary | ICD-10-CM | POA: Diagnosis not present

## 2020-10-25 DIAGNOSIS — M65171 Other infective (teno)synovitis, right ankle and foot: Secondary | ICD-10-CM | POA: Diagnosis not present

## 2020-10-25 DIAGNOSIS — K219 Gastro-esophageal reflux disease without esophagitis: Secondary | ICD-10-CM | POA: Diagnosis not present

## 2020-10-25 DIAGNOSIS — E1169 Type 2 diabetes mellitus with other specified complication: Secondary | ICD-10-CM | POA: Diagnosis not present

## 2020-10-25 DIAGNOSIS — Z89422 Acquired absence of other left toe(s): Secondary | ICD-10-CM | POA: Diagnosis not present

## 2020-10-25 DIAGNOSIS — I96 Gangrene, not elsewhere classified: Secondary | ICD-10-CM | POA: Diagnosis not present

## 2020-10-25 DIAGNOSIS — M86179 Other acute osteomyelitis, unspecified ankle and foot: Secondary | ICD-10-CM | POA: Diagnosis not present

## 2020-10-25 DIAGNOSIS — L089 Local infection of the skin and subcutaneous tissue, unspecified: Secondary | ICD-10-CM | POA: Diagnosis not present

## 2020-10-25 DIAGNOSIS — E11628 Type 2 diabetes mellitus with other skin complications: Secondary | ICD-10-CM | POA: Diagnosis not present

## 2020-10-26 DIAGNOSIS — E785 Hyperlipidemia, unspecified: Secondary | ICD-10-CM | POA: Insufficient documentation

## 2020-11-02 DIAGNOSIS — L97522 Non-pressure chronic ulcer of other part of left foot with fat layer exposed: Secondary | ICD-10-CM | POA: Diagnosis not present

## 2020-11-02 DIAGNOSIS — L03116 Cellulitis of left lower limb: Secondary | ICD-10-CM | POA: Diagnosis not present

## 2020-11-02 DIAGNOSIS — E11628 Type 2 diabetes mellitus with other skin complications: Secondary | ICD-10-CM | POA: Diagnosis not present

## 2020-11-04 ENCOUNTER — Inpatient Hospital Stay: Payer: BC Managed Care – PPO | Admitting: Internal Medicine

## 2020-11-04 ENCOUNTER — Inpatient Hospital Stay: Payer: BC Managed Care – PPO | Attending: Internal Medicine

## 2020-11-08 DIAGNOSIS — I152 Hypertension secondary to endocrine disorders: Secondary | ICD-10-CM | POA: Diagnosis not present

## 2020-11-08 DIAGNOSIS — E1159 Type 2 diabetes mellitus with other circulatory complications: Secondary | ICD-10-CM | POA: Diagnosis not present

## 2020-11-08 DIAGNOSIS — F331 Major depressive disorder, recurrent, moderate: Secondary | ICD-10-CM | POA: Diagnosis not present

## 2020-11-08 DIAGNOSIS — Z89411 Acquired absence of right great toe: Secondary | ICD-10-CM | POA: Diagnosis not present

## 2020-11-08 DIAGNOSIS — Z89422 Acquired absence of other left toe(s): Secondary | ICD-10-CM | POA: Diagnosis not present

## 2020-11-09 DIAGNOSIS — Z7984 Long term (current) use of oral hypoglycemic drugs: Secondary | ICD-10-CM | POA: Diagnosis not present

## 2020-11-09 DIAGNOSIS — L97522 Non-pressure chronic ulcer of other part of left foot with fat layer exposed: Secondary | ICD-10-CM | POA: Diagnosis not present

## 2020-11-09 DIAGNOSIS — E11628 Type 2 diabetes mellitus with other skin complications: Secondary | ICD-10-CM | POA: Diagnosis not present

## 2020-11-15 DIAGNOSIS — Z89411 Acquired absence of right great toe: Secondary | ICD-10-CM | POA: Diagnosis not present

## 2020-11-15 DIAGNOSIS — L97522 Non-pressure chronic ulcer of other part of left foot with fat layer exposed: Secondary | ICD-10-CM | POA: Diagnosis not present

## 2020-11-16 DIAGNOSIS — E11628 Type 2 diabetes mellitus with other skin complications: Secondary | ICD-10-CM | POA: Diagnosis not present

## 2020-11-16 DIAGNOSIS — L97522 Non-pressure chronic ulcer of other part of left foot with fat layer exposed: Secondary | ICD-10-CM | POA: Diagnosis not present

## 2020-11-16 DIAGNOSIS — Z7984 Long term (current) use of oral hypoglycemic drugs: Secondary | ICD-10-CM | POA: Diagnosis not present

## 2020-11-23 DIAGNOSIS — E11628 Type 2 diabetes mellitus with other skin complications: Secondary | ICD-10-CM | POA: Diagnosis not present

## 2020-11-23 DIAGNOSIS — Z7984 Long term (current) use of oral hypoglycemic drugs: Secondary | ICD-10-CM | POA: Diagnosis not present

## 2020-11-23 DIAGNOSIS — L97522 Non-pressure chronic ulcer of other part of left foot with fat layer exposed: Secondary | ICD-10-CM | POA: Diagnosis not present

## 2020-11-24 DIAGNOSIS — M6510 Other infective (teno)synovitis, unspecified site: Secondary | ICD-10-CM | POA: Diagnosis not present

## 2020-11-24 DIAGNOSIS — M00071 Staphylococcal arthritis, right ankle and foot: Secondary | ICD-10-CM | POA: Diagnosis not present

## 2020-11-24 DIAGNOSIS — M869 Osteomyelitis, unspecified: Secondary | ICD-10-CM | POA: Diagnosis not present

## 2020-11-24 DIAGNOSIS — E11628 Type 2 diabetes mellitus with other skin complications: Secondary | ICD-10-CM | POA: Diagnosis not present

## 2020-11-30 DIAGNOSIS — L97522 Non-pressure chronic ulcer of other part of left foot with fat layer exposed: Secondary | ICD-10-CM | POA: Diagnosis not present

## 2020-11-30 DIAGNOSIS — E11628 Type 2 diabetes mellitus with other skin complications: Secondary | ICD-10-CM | POA: Diagnosis not present

## 2020-11-30 DIAGNOSIS — Z7984 Long term (current) use of oral hypoglycemic drugs: Secondary | ICD-10-CM | POA: Diagnosis not present

## 2020-11-30 DIAGNOSIS — Z79899 Other long term (current) drug therapy: Secondary | ICD-10-CM | POA: Diagnosis not present

## 2020-12-06 DIAGNOSIS — Z9889 Other specified postprocedural states: Secondary | ICD-10-CM | POA: Diagnosis not present

## 2020-12-06 DIAGNOSIS — E11621 Type 2 diabetes mellitus with foot ulcer: Secondary | ICD-10-CM | POA: Diagnosis not present

## 2020-12-06 DIAGNOSIS — L97522 Non-pressure chronic ulcer of other part of left foot with fat layer exposed: Secondary | ICD-10-CM | POA: Diagnosis not present

## 2020-12-13 DIAGNOSIS — Z89411 Acquired absence of right great toe: Secondary | ICD-10-CM | POA: Diagnosis not present

## 2020-12-13 DIAGNOSIS — L97522 Non-pressure chronic ulcer of other part of left foot with fat layer exposed: Secondary | ICD-10-CM | POA: Diagnosis not present

## 2020-12-13 DIAGNOSIS — M6702 Short Achilles tendon (acquired), left ankle: Secondary | ICD-10-CM | POA: Insufficient documentation

## 2020-12-17 DIAGNOSIS — L97429 Non-pressure chronic ulcer of left heel and midfoot with unspecified severity: Secondary | ICD-10-CM | POA: Diagnosis not present

## 2020-12-17 DIAGNOSIS — L97522 Non-pressure chronic ulcer of other part of left foot with fat layer exposed: Secondary | ICD-10-CM | POA: Diagnosis not present

## 2020-12-17 DIAGNOSIS — M6702 Short Achilles tendon (acquired), left ankle: Secondary | ICD-10-CM | POA: Diagnosis not present

## 2020-12-20 DIAGNOSIS — M6702 Short Achilles tendon (acquired), left ankle: Secondary | ICD-10-CM | POA: Diagnosis not present

## 2020-12-20 DIAGNOSIS — L97522 Non-pressure chronic ulcer of other part of left foot with fat layer exposed: Secondary | ICD-10-CM | POA: Diagnosis not present

## 2020-12-27 DIAGNOSIS — M6702 Short Achilles tendon (acquired), left ankle: Secondary | ICD-10-CM | POA: Diagnosis not present

## 2021-01-03 DIAGNOSIS — L97522 Non-pressure chronic ulcer of other part of left foot with fat layer exposed: Secondary | ICD-10-CM | POA: Diagnosis not present

## 2021-01-03 DIAGNOSIS — M6702 Short Achilles tendon (acquired), left ankle: Secondary | ICD-10-CM | POA: Diagnosis not present

## 2021-02-16 DIAGNOSIS — Z89422 Acquired absence of other left toe(s): Secondary | ICD-10-CM | POA: Diagnosis not present

## 2021-02-16 DIAGNOSIS — I152 Hypertension secondary to endocrine disorders: Secondary | ICD-10-CM | POA: Diagnosis not present

## 2021-02-16 DIAGNOSIS — E782 Mixed hyperlipidemia: Secondary | ICD-10-CM | POA: Diagnosis not present

## 2021-02-16 DIAGNOSIS — F331 Major depressive disorder, recurrent, moderate: Secondary | ICD-10-CM | POA: Diagnosis not present

## 2021-02-16 DIAGNOSIS — E1159 Type 2 diabetes mellitus with other circulatory complications: Secondary | ICD-10-CM | POA: Diagnosis not present

## 2021-02-16 DIAGNOSIS — E611 Iron deficiency: Secondary | ICD-10-CM | POA: Diagnosis not present

## 2021-02-21 DIAGNOSIS — L03116 Cellulitis of left lower limb: Secondary | ICD-10-CM | POA: Diagnosis not present

## 2021-02-21 DIAGNOSIS — L97421 Non-pressure chronic ulcer of left heel and midfoot limited to breakdown of skin: Secondary | ICD-10-CM | POA: Diagnosis not present

## 2021-03-08 DIAGNOSIS — L97421 Non-pressure chronic ulcer of left heel and midfoot limited to breakdown of skin: Secondary | ICD-10-CM | POA: Diagnosis not present

## 2021-03-08 DIAGNOSIS — L97522 Non-pressure chronic ulcer of other part of left foot with fat layer exposed: Secondary | ICD-10-CM | POA: Diagnosis not present

## 2021-03-29 DIAGNOSIS — M6702 Short Achilles tendon (acquired), left ankle: Secondary | ICD-10-CM | POA: Diagnosis not present

## 2021-03-29 DIAGNOSIS — L97421 Non-pressure chronic ulcer of left heel and midfoot limited to breakdown of skin: Secondary | ICD-10-CM | POA: Diagnosis not present

## 2021-03-30 DIAGNOSIS — N390 Urinary tract infection, site not specified: Secondary | ICD-10-CM | POA: Diagnosis not present

## 2021-04-15 DIAGNOSIS — M9901 Segmental and somatic dysfunction of cervical region: Secondary | ICD-10-CM | POA: Diagnosis not present

## 2021-04-18 DIAGNOSIS — M9901 Segmental and somatic dysfunction of cervical region: Secondary | ICD-10-CM | POA: Diagnosis not present

## 2021-04-19 DIAGNOSIS — L97522 Non-pressure chronic ulcer of other part of left foot with fat layer exposed: Secondary | ICD-10-CM | POA: Diagnosis not present

## 2021-04-19 DIAGNOSIS — M9901 Segmental and somatic dysfunction of cervical region: Secondary | ICD-10-CM | POA: Diagnosis not present

## 2021-04-21 DIAGNOSIS — M9901 Segmental and somatic dysfunction of cervical region: Secondary | ICD-10-CM | POA: Diagnosis not present

## 2021-04-25 DIAGNOSIS — M9901 Segmental and somatic dysfunction of cervical region: Secondary | ICD-10-CM | POA: Diagnosis not present

## 2021-04-26 DIAGNOSIS — M9901 Segmental and somatic dysfunction of cervical region: Secondary | ICD-10-CM | POA: Diagnosis not present

## 2021-04-28 DIAGNOSIS — M9901 Segmental and somatic dysfunction of cervical region: Secondary | ICD-10-CM | POA: Diagnosis not present

## 2021-05-02 DIAGNOSIS — M9901 Segmental and somatic dysfunction of cervical region: Secondary | ICD-10-CM | POA: Diagnosis not present

## 2021-05-03 DIAGNOSIS — M9901 Segmental and somatic dysfunction of cervical region: Secondary | ICD-10-CM | POA: Diagnosis not present

## 2021-05-05 DIAGNOSIS — M9901 Segmental and somatic dysfunction of cervical region: Secondary | ICD-10-CM | POA: Diagnosis not present

## 2021-05-10 DIAGNOSIS — M7989 Other specified soft tissue disorders: Secondary | ICD-10-CM | POA: Insufficient documentation

## 2021-05-10 DIAGNOSIS — E11621 Type 2 diabetes mellitus with foot ulcer: Secondary | ICD-10-CM | POA: Diagnosis not present

## 2021-05-10 DIAGNOSIS — L97522 Non-pressure chronic ulcer of other part of left foot with fat layer exposed: Secondary | ICD-10-CM | POA: Diagnosis not present

## 2021-05-10 DIAGNOSIS — M9901 Segmental and somatic dysfunction of cervical region: Secondary | ICD-10-CM | POA: Diagnosis not present

## 2021-05-10 DIAGNOSIS — L97512 Non-pressure chronic ulcer of other part of right foot with fat layer exposed: Secondary | ICD-10-CM | POA: Diagnosis not present

## 2021-05-11 DIAGNOSIS — M14671 Charcot's joint, right ankle and foot: Secondary | ICD-10-CM | POA: Insufficient documentation

## 2021-05-12 DIAGNOSIS — M9901 Segmental and somatic dysfunction of cervical region: Secondary | ICD-10-CM | POA: Diagnosis not present

## 2021-05-13 DIAGNOSIS — Z89422 Acquired absence of other left toe(s): Secondary | ICD-10-CM | POA: Diagnosis not present

## 2021-05-13 DIAGNOSIS — E1159 Type 2 diabetes mellitus with other circulatory complications: Secondary | ICD-10-CM | POA: Diagnosis not present

## 2021-05-13 DIAGNOSIS — I152 Hypertension secondary to endocrine disorders: Secondary | ICD-10-CM | POA: Diagnosis not present

## 2021-05-13 DIAGNOSIS — E782 Mixed hyperlipidemia: Secondary | ICD-10-CM | POA: Diagnosis not present

## 2021-05-13 DIAGNOSIS — F331 Major depressive disorder, recurrent, moderate: Secondary | ICD-10-CM | POA: Diagnosis not present

## 2021-05-17 DIAGNOSIS — L97512 Non-pressure chronic ulcer of other part of right foot with fat layer exposed: Secondary | ICD-10-CM | POA: Diagnosis not present

## 2021-05-17 DIAGNOSIS — L97522 Non-pressure chronic ulcer of other part of left foot with fat layer exposed: Secondary | ICD-10-CM | POA: Diagnosis not present

## 2021-05-17 DIAGNOSIS — M7989 Other specified soft tissue disorders: Secondary | ICD-10-CM | POA: Diagnosis not present

## 2021-05-17 DIAGNOSIS — M14671 Charcot's joint, right ankle and foot: Secondary | ICD-10-CM | POA: Diagnosis not present

## 2021-05-24 DIAGNOSIS — M9901 Segmental and somatic dysfunction of cervical region: Secondary | ICD-10-CM | POA: Diagnosis not present

## 2021-05-26 DIAGNOSIS — M9901 Segmental and somatic dysfunction of cervical region: Secondary | ICD-10-CM | POA: Diagnosis not present

## 2021-05-31 DIAGNOSIS — L97512 Non-pressure chronic ulcer of other part of right foot with fat layer exposed: Secondary | ICD-10-CM | POA: Diagnosis not present

## 2021-05-31 DIAGNOSIS — L97522 Non-pressure chronic ulcer of other part of left foot with fat layer exposed: Secondary | ICD-10-CM | POA: Diagnosis not present

## 2021-05-31 DIAGNOSIS — E11621 Type 2 diabetes mellitus with foot ulcer: Secondary | ICD-10-CM | POA: Diagnosis not present

## 2021-05-31 DIAGNOSIS — M9901 Segmental and somatic dysfunction of cervical region: Secondary | ICD-10-CM | POA: Diagnosis not present

## 2021-05-31 DIAGNOSIS — M14671 Charcot's joint, right ankle and foot: Secondary | ICD-10-CM | POA: Diagnosis not present

## 2021-06-02 DIAGNOSIS — M9901 Segmental and somatic dysfunction of cervical region: Secondary | ICD-10-CM | POA: Diagnosis not present

## 2021-06-07 DIAGNOSIS — M9901 Segmental and somatic dysfunction of cervical region: Secondary | ICD-10-CM | POA: Diagnosis not present

## 2021-06-09 DIAGNOSIS — M9901 Segmental and somatic dysfunction of cervical region: Secondary | ICD-10-CM | POA: Diagnosis not present

## 2021-06-14 DIAGNOSIS — M9901 Segmental and somatic dysfunction of cervical region: Secondary | ICD-10-CM | POA: Diagnosis not present

## 2021-06-14 DIAGNOSIS — M50323 Other cervical disc degeneration at C6-C7 level: Secondary | ICD-10-CM | POA: Diagnosis not present

## 2021-06-14 DIAGNOSIS — M50322 Other cervical disc degeneration at C5-C6 level: Secondary | ICD-10-CM | POA: Diagnosis not present

## 2021-06-14 DIAGNOSIS — M9902 Segmental and somatic dysfunction of thoracic region: Secondary | ICD-10-CM | POA: Diagnosis not present

## 2021-06-16 DIAGNOSIS — M50323 Other cervical disc degeneration at C6-C7 level: Secondary | ICD-10-CM | POA: Diagnosis not present

## 2021-06-16 DIAGNOSIS — M9901 Segmental and somatic dysfunction of cervical region: Secondary | ICD-10-CM | POA: Diagnosis not present

## 2021-06-16 DIAGNOSIS — M9902 Segmental and somatic dysfunction of thoracic region: Secondary | ICD-10-CM | POA: Diagnosis not present

## 2021-06-16 DIAGNOSIS — M50322 Other cervical disc degeneration at C5-C6 level: Secondary | ICD-10-CM | POA: Diagnosis not present

## 2021-06-28 DIAGNOSIS — L97522 Non-pressure chronic ulcer of other part of left foot with fat layer exposed: Secondary | ICD-10-CM | POA: Diagnosis not present

## 2021-06-28 DIAGNOSIS — M6702 Short Achilles tendon (acquired), left ankle: Secondary | ICD-10-CM | POA: Diagnosis not present

## 2021-06-29 DIAGNOSIS — E11621 Type 2 diabetes mellitus with foot ulcer: Secondary | ICD-10-CM | POA: Diagnosis not present

## 2021-06-29 DIAGNOSIS — L97522 Non-pressure chronic ulcer of other part of left foot with fat layer exposed: Secondary | ICD-10-CM | POA: Diagnosis not present

## 2021-07-01 DIAGNOSIS — Z7985 Long-term (current) use of injectable non-insulin antidiabetic drugs: Secondary | ICD-10-CM | POA: Diagnosis not present

## 2021-07-01 DIAGNOSIS — Z7984 Long term (current) use of oral hypoglycemic drugs: Secondary | ICD-10-CM | POA: Diagnosis not present

## 2021-07-01 DIAGNOSIS — L97522 Non-pressure chronic ulcer of other part of left foot with fat layer exposed: Secondary | ICD-10-CM | POA: Diagnosis not present

## 2021-07-01 DIAGNOSIS — E11621 Type 2 diabetes mellitus with foot ulcer: Secondary | ICD-10-CM | POA: Diagnosis not present

## 2021-07-05 DIAGNOSIS — M25375 Other instability, left foot: Secondary | ICD-10-CM | POA: Diagnosis not present

## 2021-07-06 DIAGNOSIS — E11621 Type 2 diabetes mellitus with foot ulcer: Secondary | ICD-10-CM | POA: Diagnosis not present

## 2021-07-06 DIAGNOSIS — L97422 Non-pressure chronic ulcer of left heel and midfoot with fat layer exposed: Secondary | ICD-10-CM | POA: Diagnosis not present

## 2021-07-20 DIAGNOSIS — L97522 Non-pressure chronic ulcer of other part of left foot with fat layer exposed: Secondary | ICD-10-CM | POA: Diagnosis not present

## 2021-07-20 DIAGNOSIS — E11621 Type 2 diabetes mellitus with foot ulcer: Secondary | ICD-10-CM | POA: Diagnosis not present

## 2021-07-20 DIAGNOSIS — L97422 Non-pressure chronic ulcer of left heel and midfoot with fat layer exposed: Secondary | ICD-10-CM | POA: Diagnosis not present

## 2021-07-27 DIAGNOSIS — L97522 Non-pressure chronic ulcer of other part of left foot with fat layer exposed: Secondary | ICD-10-CM | POA: Diagnosis not present

## 2021-07-27 DIAGNOSIS — E11621 Type 2 diabetes mellitus with foot ulcer: Secondary | ICD-10-CM | POA: Diagnosis not present

## 2021-07-27 DIAGNOSIS — L97422 Non-pressure chronic ulcer of left heel and midfoot with fat layer exposed: Secondary | ICD-10-CM | POA: Diagnosis not present

## 2021-08-03 DIAGNOSIS — E11621 Type 2 diabetes mellitus with foot ulcer: Secondary | ICD-10-CM | POA: Diagnosis not present

## 2021-08-03 DIAGNOSIS — L97422 Non-pressure chronic ulcer of left heel and midfoot with fat layer exposed: Secondary | ICD-10-CM | POA: Diagnosis not present

## 2021-08-03 DIAGNOSIS — L97522 Non-pressure chronic ulcer of other part of left foot with fat layer exposed: Secondary | ICD-10-CM | POA: Diagnosis not present

## 2021-08-08 DIAGNOSIS — M14671 Charcot's joint, right ankle and foot: Secondary | ICD-10-CM | POA: Diagnosis not present

## 2021-08-11 DIAGNOSIS — E11621 Type 2 diabetes mellitus with foot ulcer: Secondary | ICD-10-CM | POA: Diagnosis not present

## 2021-08-11 DIAGNOSIS — L97522 Non-pressure chronic ulcer of other part of left foot with fat layer exposed: Secondary | ICD-10-CM | POA: Diagnosis not present

## 2021-08-11 DIAGNOSIS — L97422 Non-pressure chronic ulcer of left heel and midfoot with fat layer exposed: Secondary | ICD-10-CM | POA: Diagnosis not present

## 2021-08-16 DIAGNOSIS — Z89422 Acquired absence of other left toe(s): Secondary | ICD-10-CM | POA: Diagnosis not present

## 2021-08-16 DIAGNOSIS — F331 Major depressive disorder, recurrent, moderate: Secondary | ICD-10-CM | POA: Diagnosis not present

## 2021-08-16 DIAGNOSIS — E611 Iron deficiency: Secondary | ICD-10-CM | POA: Diagnosis not present

## 2021-08-16 DIAGNOSIS — E1159 Type 2 diabetes mellitus with other circulatory complications: Secondary | ICD-10-CM | POA: Diagnosis not present

## 2021-08-16 DIAGNOSIS — I152 Hypertension secondary to endocrine disorders: Secondary | ICD-10-CM | POA: Diagnosis not present

## 2021-08-16 DIAGNOSIS — E782 Mixed hyperlipidemia: Secondary | ICD-10-CM | POA: Diagnosis not present

## 2021-08-17 DIAGNOSIS — E11621 Type 2 diabetes mellitus with foot ulcer: Secondary | ICD-10-CM | POA: Diagnosis not present

## 2021-08-17 DIAGNOSIS — L97522 Non-pressure chronic ulcer of other part of left foot with fat layer exposed: Secondary | ICD-10-CM | POA: Diagnosis not present

## 2021-08-17 DIAGNOSIS — L03116 Cellulitis of left lower limb: Secondary | ICD-10-CM | POA: Diagnosis not present

## 2021-08-17 DIAGNOSIS — L97422 Non-pressure chronic ulcer of left heel and midfoot with fat layer exposed: Secondary | ICD-10-CM | POA: Diagnosis not present

## 2021-08-17 DIAGNOSIS — M1469 Charcot's joint, multiple sites: Secondary | ICD-10-CM | POA: Diagnosis not present

## 2021-08-22 ENCOUNTER — Other Ambulatory Visit: Payer: Self-pay

## 2021-08-22 ENCOUNTER — Telehealth: Payer: Self-pay | Admitting: Hematology and Oncology

## 2021-08-22 NOTE — Telephone Encounter (Signed)
Pt called in to sch appt per 12/19 referral. Pt is aware of appt date and time.

## 2021-08-23 NOTE — Progress Notes (Cosign Needed)
[facility]  [address] [place],  [place]  [address] [phone]  Clinic Day:  08/25/2021  Referring physician: Carolee Rota, NP  REASON FOR CONSULTATION:  Iron deficiency anemia  HISTORY OF PRESENT ILLNESS:  Chloe Morrow is a 40 y.o. female with a history of iron deficiency anemia who is referred for consultation by Carvel Getting, NP for management of recurrent iron deficiency.  This is a transfer of care, as she has previously seen Dr. Julien Nordmann and received IV iron, the last being in May 2021.  She states it is easier for her to get here than to [facility].  CBC on December 13th at Ms. Foreman's office revealed a hemoglobin of 11.3 with an MCV of 73.  Iron studies revealed a iron saturation of 8%, total iron binding capacity of 366 ug/mL and ferritin of 11 ng/ML.  She states that she underwent EGD and colonoscopy with a doctor in [place] in December 2020, but she does not recall her name.  Apparently, no source of blood loss was found.  She denies melena or hematochezia.  The patient reports regular monthly menses with 4 to 5 days of bleeding, 2 heavy days without bleeding through a pad or significant clotting.  She denies any other overt form of blood loss.  She reports a history of PICC line associated thrombus in 2018, for which she was treated with anticoagulation for 6 months.  She has medical comorbidities of hypertension and diabetes with history of diabetic ulcers of the feet with intermittent infection.  She has undergone amputation of the left fifth toe, as well as the right great toe.  She is currently being treated for ulcers of the left foot she states she completed a course of doxycycline yesterday.  She continues to see her wound specialist weekly.  She works as a Psychologist, educational.  REVIEW OF SYSTEMS:  Review of Systems  Constitutional:  Positive for fatigue. Negative for appetite change, chills, fever and unexpected weight change.   HENT:   Negative for lump/mass, mouth sores, nosebleeds and sore throat.   Respiratory:  Negative for cough, hemoptysis and shortness of breath.   Cardiovascular:  Negative for chest pain and leg swelling.  Gastrointestinal:  Negative for abdominal pain, blood in stool, constipation, diarrhea, nausea and vomiting.  Genitourinary:  Negative for difficulty urinating, dysuria, frequency, hematuria and menstrual problem.   Musculoskeletal:  Negative for arthralgias, back pain and myalgias.  Skin:  Positive for wound (Left foot). Negative for rash.  Neurological:  Negative for dizziness and headaches.  Hematological:  Negative for adenopathy. Does not bruise/bleed easily.  Psychiatric/Behavioral:  Positive for depression (Taking medication) and sleep disturbance (Taking medication). The patient is not nervous/anxious.     VITALS:  Blood pressure (!) 146/86, pulse 96, temperature 99.9 F (37.7 C), temperature source Oral, resp. rate 18, height 5\' 11"  (1.803 m), weight 265 lb 3.2 oz (120.3 kg), SpO2 97 %.  Wt Readings from Last 3 Encounters:  08/24/21 265 lb 3.2 oz (120.3 kg)  07/07/20 247 lb 6.4 oz (112.2 kg)  04/06/20 258 lb 12.8 oz (117.4 kg)    Body mass index is 36.99 kg/m.  Performance status (ECOG): 2 - Symptomatic, <50% confined to bed  PHYSICAL EXAM:  Physical Exam Vitals and nursing note reviewed.  Constitutional:      General: She is not in acute distress.    Appearance: Normal appearance.  HENT:     Head: Normocephalic and atraumatic.  Mouth/Throat:     Mouth: Mucous membranes are moist.     Pharynx: Oropharynx is clear. No oropharyngeal exudate or posterior oropharyngeal erythema.  Eyes:     General: No scleral icterus.    Extraocular Movements: Extraocular movements intact.     Conjunctiva/sclera: Conjunctivae normal.     Pupils: Pupils are equal, round, and reactive to light.  Cardiovascular:     Rate and Rhythm: Normal rate and regular rhythm.     Heart sounds:  Normal heart sounds. No murmur heard.   No friction rub. No gallop.  Pulmonary:     Effort: Pulmonary effort is normal.     Breath sounds: Normal breath sounds. No wheezing, rhonchi or rales.  Abdominal:     General: There is no distension.     Palpations: Abdomen is soft. There is no hepatomegaly, splenomegaly or mass.     Tenderness: There is no abdominal tenderness.  Musculoskeletal:     Right lower leg: No edema.     Left lower leg: No edema.  Lymphadenopathy:     Cervical: No cervical adenopathy.     Upper Body:     Right upper body: No supraclavicular or axillary adenopathy.     Left upper body: No supraclavicular or axillary adenopathy.     Lower Body: No right inguinal adenopathy. No left inguinal adenopathy.  Skin:    General: Skin is warm and dry.     Coloration: Skin is not jaundiced.     Findings: No rash.  Neurological:     Mental Status: She is alert and oriented to person, place, and time.     Cranial Nerves: No cranial nerve deficit.  Psychiatric:        Mood and Affect: Mood normal.        Behavior: Behavior normal.        Thought Content: Thought content normal.    LABS:   CBC Latest Ref Rng & Units 07/07/2020 04/06/2020 01/05/2020  WBC 4.0 - 10.5 K/uL 10.1 7.4 6.6  Hemoglobin 12.0 - 15.0 g/dL 16.1 09.6 04.5  Hematocrit 36.0 - 46.0 % 42.4 42.9 41.6  Platelets 150 - 400 K/uL 450(H) 363 301   CMP Latest Ref Rng & Units 06/02/2019  Glucose 70 - 99 mg/dL 409(W)  BUN 6 - 20 mg/dL 11  Creatinine 1.19 - 1.47 mg/dL 8.29  Sodium 562 - 130 mmol/L 138  Potassium 3.5 - 5.1 mmol/L 3.6  Chloride 98 - 111 mmol/L 105  CO2 22 - 32 mmol/L 22  Calcium 8.9 - 10.3 mg/dL 8.6(V)  Total Protein 6.5 - 8.1 g/dL 7.5  Total Bilirubin 0.3 - 1.2 mg/dL 0.3  Alkaline Phos 38 - 126 U/L 83  AST 15 - 41 U/L 22  ALT 0 - 44 U/L 32     No results found for: CEA1 / No results found for: CEA1 No results found for: PSA1 No results found for: HQI696 No results found for: CAN125  No  results found for: Dorene Ar, A1GS, A2GS, Evans Lance, GAMS, MSPIKE, SPEI Lab Results  Component Value Date   TIBC 334 07/07/2020   TIBC 299 04/06/2020   TIBC 308 01/05/2020   FERRITIN 33 07/07/2020   FERRITIN 48 04/06/2020   FERRITIN 44 01/05/2020   IRONPCTSAT 20 (L) 07/07/2020   IRONPCTSAT 18 (L) 04/06/2020   IRONPCTSAT 12 (L) 01/05/2020   Lab Results  Component Value Date   LDH 142 06/02/2019    STUDIES:  No results found.  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)    Hyperlipidemia    Hypertension    Hypertension    MRSA (methicillin resistant Staphylococcus aureus)    Osteomyelitis (HCC)    Short of breath on exertion     Past Surgical History:  Procedure Laterality Date   AMPUTATION OF REPLICATED TOES     amputation of toes do to infection in bone left foot 2 toes in 2018 and 2021 right foot one toe amputated in2022   back  1996   rods   OVARIAN CYST REMOVAL Right 1997    Family History  Problem Relation Age of Onset   Hypertension Mother    Stroke Mother    Scleroderma Mother    Hypertension Father    Obstructive Sleep Apnea Father     Social History:  reports that she has never smoked. She has never used smokeless tobacco. She reports that she does not currently use alcohol. She reports that she does not currently use drugs.The patient is alone today.  Allergies:  Allergies  Allergen Reactions   Penicillins Other (See Comments)    Resistance to drug.   Sulfa Antibiotics     Resistance to drugs   Vancomycin Itching    Pt states that she feels itchy with Vancomycin infusion but she can take benadryl and feels ok.    Current Medications: Current Outpatient Medications  Medication Sig  Dispense Refill   ADDERALL XR 20 MG 24 hr capsule Take 1 capsule by mouth daily.     amLODipine (NORVASC) 5 MG tablet TAKE 1 TABLET(5 MG) BY MOUTH DAILY     atorvastatin (LIPITOR) 10 MG tablet Take 1 tablet by mouth daily.     Dulaglutide (TRULICITY) 3 0000000 SOPN Inject into the skin.     FARXIGA 10 MG TABS tablet Take 10 mg by mouth daily.     FEROSUL 325 (65 Fe) MG tablet Take 325 mg by mouth daily.     glyBURIDE-metformin (GLUCOVANCE) 5-500 MG tablet Take by mouth.     hydrOXYzine (ATARAX) 25 MG tablet SMARTSIG:1-2 Tablet(s) By Mouth PRN     icosapent Ethyl (VASCEPA) 1 g capsule Take 2 capsules by mouth daily.     lisinopril-hydrochlorothiazide (ZESTORETIC) 20-12.5 MG tablet Take 1 tablet by mouth 2 (two) times daily.      Melatonin 10 MG TABS Take 1 tablet by mouth at bedtime.     pantoprazole (PROTONIX) 20 MG tablet Take 20 mg by mouth daily.     rosuvastatin (CRESTOR) 5 MG tablet Take 5 mg by mouth daily.     traZODone (DESYREL) 50 MG tablet Take 50 mg by mouth at bedtime.     venlafaxine XR (EFFEXOR-XR) 37.5 MG 24 hr capsule      No current facility-administered medications for this visit.     ASSESSMENT & PLAN:   Assessment/plan:  Chloe Morrow is a 40 y.o. female with recurrent iron deficiency of uncertain etiology.  We will arrange for her to have IV iron replacement again at this time.  We recommend that she see her gastroenterologist again for consideration of evaluation for blood loss from the small intestine, such as arteriovenous malformations.  The patient  wishes to see a gastroenterologist in [place], so we will refer her locally.  We will plan to see her back in 3 months for repeat clinical assessment.  I discussed the assessment and treatment plan with the patient.  The patient was provided an opportunity to ask questions and all were answered.  The patient agreed with the plan and demonstrated an understanding of the instructions.  The patient was advised to call back  if the symptoms worsen or if the condition fails to improve as anticipated.  Thank you for the opportunity to care for this patient.   The patient was staffed and plan formulated with Dr. Bobby Rumpf.   Marvia Pickles, PA-C

## 2021-08-24 ENCOUNTER — Telehealth: Payer: Self-pay | Admitting: Oncology

## 2021-08-24 ENCOUNTER — Encounter: Payer: Self-pay | Admitting: Hematology and Oncology

## 2021-08-24 ENCOUNTER — Other Ambulatory Visit: Payer: BC Managed Care – PPO

## 2021-08-24 ENCOUNTER — Other Ambulatory Visit: Payer: Self-pay

## 2021-08-24 ENCOUNTER — Telehealth: Payer: Self-pay

## 2021-08-24 ENCOUNTER — Inpatient Hospital Stay: Payer: BC Managed Care – PPO | Attending: Hematology and Oncology | Admitting: Hematology and Oncology

## 2021-08-24 VITALS — BP 146/86 | HR 96 | Temp 99.9°F | Resp 18 | Ht 71.0 in | Wt 265.2 lb

## 2021-08-24 DIAGNOSIS — L97422 Non-pressure chronic ulcer of left heel and midfoot with fat layer exposed: Secondary | ICD-10-CM | POA: Diagnosis not present

## 2021-08-24 DIAGNOSIS — Z7984 Long term (current) use of oral hypoglycemic drugs: Secondary | ICD-10-CM | POA: Insufficient documentation

## 2021-08-24 DIAGNOSIS — D509 Iron deficiency anemia, unspecified: Secondary | ICD-10-CM | POA: Diagnosis not present

## 2021-08-24 DIAGNOSIS — Z7985 Long-term (current) use of injectable non-insulin antidiabetic drugs: Secondary | ICD-10-CM | POA: Insufficient documentation

## 2021-08-24 DIAGNOSIS — Z86718 Personal history of other venous thrombosis and embolism: Secondary | ICD-10-CM | POA: Insufficient documentation

## 2021-08-24 DIAGNOSIS — E11621 Type 2 diabetes mellitus with foot ulcer: Secondary | ICD-10-CM | POA: Diagnosis not present

## 2021-08-24 DIAGNOSIS — I10 Essential (primary) hypertension: Secondary | ICD-10-CM | POA: Insufficient documentation

## 2021-08-24 DIAGNOSIS — Z79899 Other long term (current) drug therapy: Secondary | ICD-10-CM | POA: Insufficient documentation

## 2021-08-24 DIAGNOSIS — E611 Iron deficiency: Secondary | ICD-10-CM

## 2021-08-24 DIAGNOSIS — E119 Type 2 diabetes mellitus without complications: Secondary | ICD-10-CM | POA: Insufficient documentation

## 2021-08-24 DIAGNOSIS — L97522 Non-pressure chronic ulcer of other part of left foot with fat layer exposed: Secondary | ICD-10-CM | POA: Diagnosis not present

## 2021-08-24 NOTE — Telephone Encounter (Signed)
Referral faxed to Dr Shela Commons office .

## 2021-08-24 NOTE — Telephone Encounter (Signed)
Spoke with patient and schedule all iv iron appts-08/24/21

## 2021-08-24 NOTE — Telephone Encounter (Signed)
-----   Message from Adah Perl, PA-C sent at 08/24/2021 10:54 AM EST ----- Please refer to GI-first available-for recurrent iron deficiency. Thank you!

## 2021-08-25 ENCOUNTER — Encounter: Payer: Self-pay | Admitting: Hematology and Oncology

## 2021-08-26 ENCOUNTER — Inpatient Hospital Stay: Payer: BC Managed Care – PPO

## 2021-08-26 ENCOUNTER — Other Ambulatory Visit: Payer: Self-pay

## 2021-08-26 VITALS — BP 163/87 | HR 87 | Temp 99.0°F | Resp 18 | Wt 268.0 lb

## 2021-08-26 DIAGNOSIS — I10 Essential (primary) hypertension: Secondary | ICD-10-CM | POA: Diagnosis not present

## 2021-08-26 DIAGNOSIS — Z7985 Long-term (current) use of injectable non-insulin antidiabetic drugs: Secondary | ICD-10-CM | POA: Diagnosis not present

## 2021-08-26 DIAGNOSIS — Z86718 Personal history of other venous thrombosis and embolism: Secondary | ICD-10-CM | POA: Diagnosis not present

## 2021-08-26 DIAGNOSIS — Z79899 Other long term (current) drug therapy: Secondary | ICD-10-CM | POA: Diagnosis not present

## 2021-08-26 DIAGNOSIS — D509 Iron deficiency anemia, unspecified: Secondary | ICD-10-CM

## 2021-08-26 DIAGNOSIS — E119 Type 2 diabetes mellitus without complications: Secondary | ICD-10-CM | POA: Diagnosis not present

## 2021-08-26 DIAGNOSIS — Z7984 Long term (current) use of oral hypoglycemic drugs: Secondary | ICD-10-CM | POA: Diagnosis not present

## 2021-08-26 MED ORDER — SODIUM CHLORIDE 0.9 % IV SOLN: Freq: Once | INTRAVENOUS | Status: AC

## 2021-08-26 MED ORDER — SODIUM CHLORIDE 0.9 % IV SOLN
200.0000 mg | Freq: Once | INTRAVENOUS | Status: AC
  Administered 2021-08-26: 200 mg via INTRAVENOUS
  Filled 2021-08-26: qty 200

## 2021-08-26 NOTE — Patient Instructions (Addendum)
Iron Sucrose Injection What is this medication? IRON SUCROSE (EYE ern SOO krose) treats low levels of iron (iron deficiency anemia) in people with kidney disease. Iron is a mineral that plays an important role in making red blood cells, which carry oxygen from your lungs to the rest of your body. This medicine may be used for other purposes; ask your health care provider or pharmacist if you have questions. COMMON BRAND NAME(S): Venofer What should I tell my care team before I take this medication? They need to know if you have any of these conditions: Anemia not caused by low iron levels Heart disease High levels of iron in the blood Kidney disease Liver disease An unusual or allergic reaction to iron, other medications, foods, dyes, or preservatives Pregnant or trying to get pregnant Breast-feeding How should I use this medication? This medication is for infusion into a vein. It is given in a hospital or clinic setting. Talk to your care team about the use of this medication in children. While this medication may be prescribed for children as young as 2 years for selected conditions, precautions do apply. Overdosage: If you think you have taken too much of this medicine contact a poison control center or emergency room at once. NOTE: This medicine is only for you. Do not share this medicine with others. What if I miss a dose? It is important not to miss your dose. Call your care team if you are unable to keep an appointment. What may interact with this medication? Do not take this medication with any of the following: Deferoxamine Dimercaprol Other iron products This medication may also interact with the following: Chloramphenicol Deferasirox This list may not describe all possible interactions. Give your health care provider a list of all the medicines, herbs, non-prescription drugs, or dietary supplements you use. Also tell them if you smoke, drink alcohol, or use illegal drugs.  Some items may interact with your medicine. What should I watch for while using this medication? Visit your care team regularly. Tell your care team if your symptoms do not start to get better or if they get worse. You may need blood work done while you are taking this medication. You may need to follow a special diet. Talk to your care team. Foods that contain iron include: whole grains/cereals, dried fruits, beans, or peas, leafy green vegetables, and organ meats (liver, kidney). What side effects may I notice from receiving this medication? Side effects that you should report to your care team as soon as possible: Allergic reactions--skin rash, itching, hives, swelling of the face, lips, tongue, or throat Low blood pressure--dizziness, feeling faint or lightheaded, blurry vision Shortness of breath Side effects that usually do not require medical attention (report to your care team if they continue or are bothersome): Flushing Headache Joint pain Muscle pain Nausea Pain, redness, or irritation at injection site This list may not describe all possible side effects. Call your doctor for medical advice about side effects. You may report side effects to FDA at 1-800-FDA-1088. Where should I keep my medication? This medication is given in a hospital or clinic and will not be stored at home. NOTE: This sheet is a summary. It may not cover all possible information. If you have questions about this medicine, talk to your doctor, pharmacist, or health care provider.  2022 Elsevier/Gold Standard (2021-01-14 00:00:00) Iron Sucrose Injection What is this medication? IRON SUCROSE (EYE ern SOO krose) treats low levels of iron (iron deficiency anemia) in people  with kidney disease. Iron is a mineral that plays an important role in making red blood cells, which carry oxygen from your lungs to the rest of your body. This medicine may be used for other purposes; ask your health care provider or pharmacist  if you have questions. COMMON BRAND NAME(S): Venofer What should I tell my care team before I take this medication? They need to know if you have any of these conditions: Anemia not caused by low iron levels Heart disease High levels of iron in the blood Kidney disease Liver disease An unusual or allergic reaction to iron, other medications, foods, dyes, or preservatives Pregnant or trying to get pregnant Breast-feeding How should I use this medication? This medication is for infusion into a vein. It is given in a hospital or clinic setting. Talk to your care team about the use of this medication in children. While this medication may be prescribed for children as young as 2 years for selected conditions, precautions do apply. Overdosage: If you think you have taken too much of this medicine contact a poison control center or emergency room at once. NOTE: This medicine is only for you. Do not share this medicine with others. What if I miss a dose? It is important not to miss your dose. Call your care team if you are unable to keep an appointment. What may interact with this medication? Do not take this medication with any of the following: Deferoxamine Dimercaprol Other iron products This medication may also interact with the following: Chloramphenicol Deferasirox This list may not describe all possible interactions. Give your health care provider a list of all the medicines, herbs, non-prescription drugs, or dietary supplements you use. Also tell them if you smoke, drink alcohol, or use illegal drugs. Some items may interact with your medicine. What should I watch for while using this medication? Visit your care team regularly. Tell your care team if your symptoms do not start to get better or if they get worse. You may need blood work done while you are taking this medication. You may need to follow a special diet. Talk to your care team. Foods that contain iron include: whole  grains/cereals, dried fruits, beans, or peas, leafy green vegetables, and organ meats (liver, kidney). What side effects may I notice from receiving this medication? Side effects that you should report to your care team as soon as possible: Allergic reactions--skin rash, itching, hives, swelling of the face, lips, tongue, or throat Low blood pressure--dizziness, feeling faint or lightheaded, blurry vision Shortness of breath Side effects that usually do not require medical attention (report to your care team if they continue or are bothersome): Flushing Headache Joint pain Muscle pain Nausea Pain, redness, or irritation at injection site This list may not describe all possible side effects. Call your doctor for medical advice about side effects. You may report side effects to FDA at 1-800-FDA-1088. Where should I keep my medication? This medication is given in a hospital or clinic and will not be stored at home. NOTE: This sheet is a summary. It may not cover all possible information. If you have questions about this medicine, talk to your doctor, pharmacist, or health care provider.  2022 Elsevier/Gold Standard (2021-01-14 00:00:00)

## 2021-08-30 ENCOUNTER — Inpatient Hospital Stay: Payer: BC Managed Care – PPO

## 2021-08-30 ENCOUNTER — Other Ambulatory Visit: Payer: Self-pay

## 2021-08-30 VITALS — BP 134/80 | HR 84 | Resp 18 | Wt 266.0 lb

## 2021-08-30 DIAGNOSIS — D509 Iron deficiency anemia, unspecified: Secondary | ICD-10-CM

## 2021-08-30 DIAGNOSIS — Z7985 Long-term (current) use of injectable non-insulin antidiabetic drugs: Secondary | ICD-10-CM | POA: Diagnosis not present

## 2021-08-30 DIAGNOSIS — I10 Essential (primary) hypertension: Secondary | ICD-10-CM | POA: Diagnosis not present

## 2021-08-30 DIAGNOSIS — Z86718 Personal history of other venous thrombosis and embolism: Secondary | ICD-10-CM | POA: Diagnosis not present

## 2021-08-30 DIAGNOSIS — E119 Type 2 diabetes mellitus without complications: Secondary | ICD-10-CM | POA: Diagnosis not present

## 2021-08-30 DIAGNOSIS — Z79899 Other long term (current) drug therapy: Secondary | ICD-10-CM | POA: Diagnosis not present

## 2021-08-30 DIAGNOSIS — Z7984 Long term (current) use of oral hypoglycemic drugs: Secondary | ICD-10-CM | POA: Diagnosis not present

## 2021-08-30 MED ORDER — SODIUM CHLORIDE 0.9 % IV SOLN
200.0000 mg | Freq: Once | INTRAVENOUS | Status: AC
  Administered 2021-08-30: 200 mg via INTRAVENOUS
  Filled 2021-08-30: qty 200

## 2021-08-30 MED ORDER — SODIUM CHLORIDE 0.9 % IV SOLN: Freq: Once | INTRAVENOUS | Status: AC

## 2021-08-30 NOTE — Patient Instructions (Signed)

## 2021-08-31 ENCOUNTER — Other Ambulatory Visit: Payer: Self-pay | Admitting: Pharmacist

## 2021-08-31 DIAGNOSIS — L97522 Non-pressure chronic ulcer of other part of left foot with fat layer exposed: Secondary | ICD-10-CM | POA: Diagnosis not present

## 2021-08-31 DIAGNOSIS — E11621 Type 2 diabetes mellitus with foot ulcer: Secondary | ICD-10-CM | POA: Diagnosis not present

## 2021-08-31 DIAGNOSIS — L97422 Non-pressure chronic ulcer of left heel and midfoot with fat layer exposed: Secondary | ICD-10-CM | POA: Diagnosis not present

## 2021-09-02 ENCOUNTER — Inpatient Hospital Stay: Payer: BC Managed Care – PPO

## 2021-09-02 ENCOUNTER — Other Ambulatory Visit: Payer: Self-pay

## 2021-09-02 VITALS — BP 147/85 | HR 105 | Resp 18 | Wt 266.0 lb

## 2021-09-02 DIAGNOSIS — I10 Essential (primary) hypertension: Secondary | ICD-10-CM | POA: Diagnosis not present

## 2021-09-02 DIAGNOSIS — Z86718 Personal history of other venous thrombosis and embolism: Secondary | ICD-10-CM | POA: Diagnosis not present

## 2021-09-02 DIAGNOSIS — Z79899 Other long term (current) drug therapy: Secondary | ICD-10-CM | POA: Diagnosis not present

## 2021-09-02 DIAGNOSIS — Z7984 Long term (current) use of oral hypoglycemic drugs: Secondary | ICD-10-CM | POA: Diagnosis not present

## 2021-09-02 DIAGNOSIS — D509 Iron deficiency anemia, unspecified: Secondary | ICD-10-CM

## 2021-09-02 DIAGNOSIS — E119 Type 2 diabetes mellitus without complications: Secondary | ICD-10-CM | POA: Diagnosis not present

## 2021-09-02 DIAGNOSIS — Z7985 Long-term (current) use of injectable non-insulin antidiabetic drugs: Secondary | ICD-10-CM | POA: Diagnosis not present

## 2021-09-02 MED ORDER — SODIUM CHLORIDE 0.9 % IV SOLN
200.0000 mg | Freq: Once | INTRAVENOUS | Status: AC
  Administered 2021-09-02: 200 mg via INTRAVENOUS
  Filled 2021-09-02: qty 10

## 2021-09-02 MED ORDER — SODIUM CHLORIDE 0.9 % IV SOLN: Freq: Once | INTRAVENOUS | Status: AC

## 2021-09-02 NOTE — Patient Instructions (Signed)

## 2021-09-06 ENCOUNTER — Inpatient Hospital Stay: Payer: BC Managed Care – PPO | Attending: Hematology and Oncology

## 2021-09-06 ENCOUNTER — Other Ambulatory Visit: Payer: Self-pay

## 2021-09-06 VITALS — BP 145/75 | HR 89 | Temp 98.1°F | Resp 18 | Ht 71.0 in | Wt 270.0 lb

## 2021-09-06 DIAGNOSIS — N920 Excessive and frequent menstruation with regular cycle: Secondary | ICD-10-CM | POA: Insufficient documentation

## 2021-09-06 DIAGNOSIS — D500 Iron deficiency anemia secondary to blood loss (chronic): Secondary | ICD-10-CM | POA: Insufficient documentation

## 2021-09-06 DIAGNOSIS — D509 Iron deficiency anemia, unspecified: Secondary | ICD-10-CM

## 2021-09-06 MED ORDER — SODIUM CHLORIDE 0.9 % IV SOLN
200.0000 mg | Freq: Once | INTRAVENOUS | Status: AC
  Administered 2021-09-06: 200 mg via INTRAVENOUS
  Filled 2021-09-06: qty 200

## 2021-09-06 MED ORDER — SODIUM CHLORIDE 0.9 % IV SOLN: Freq: Once | INTRAVENOUS | Status: AC

## 2021-09-06 NOTE — Patient Instructions (Signed)

## 2021-09-07 ENCOUNTER — Inpatient Hospital Stay: Payer: BC Managed Care – PPO

## 2021-09-07 DIAGNOSIS — E11621 Type 2 diabetes mellitus with foot ulcer: Secondary | ICD-10-CM | POA: Diagnosis not present

## 2021-09-07 DIAGNOSIS — L97522 Non-pressure chronic ulcer of other part of left foot with fat layer exposed: Secondary | ICD-10-CM | POA: Diagnosis not present

## 2021-09-07 DIAGNOSIS — L97422 Non-pressure chronic ulcer of left heel and midfoot with fat layer exposed: Secondary | ICD-10-CM | POA: Diagnosis not present

## 2021-09-08 ENCOUNTER — Encounter: Payer: Self-pay | Admitting: Hematology and Oncology

## 2021-09-08 MED FILL — Iron Sucrose Inj 20 MG/ML (Fe Equiv): INTRAVENOUS | Qty: 10 | Status: AC

## 2021-09-09 ENCOUNTER — Other Ambulatory Visit: Payer: Self-pay

## 2021-09-09 ENCOUNTER — Inpatient Hospital Stay: Payer: BC Managed Care – PPO

## 2021-09-09 DIAGNOSIS — N920 Excessive and frequent menstruation with regular cycle: Secondary | ICD-10-CM | POA: Diagnosis not present

## 2021-09-09 DIAGNOSIS — D509 Iron deficiency anemia, unspecified: Secondary | ICD-10-CM

## 2021-09-09 DIAGNOSIS — D500 Iron deficiency anemia secondary to blood loss (chronic): Secondary | ICD-10-CM | POA: Diagnosis not present

## 2021-09-09 MED ORDER — SODIUM CHLORIDE 0.9 % IV SOLN
200.0000 mg | Freq: Once | INTRAVENOUS | Status: AC
  Administered 2021-09-09: 200 mg via INTRAVENOUS
  Filled 2021-09-09: qty 200

## 2021-09-09 MED ORDER — SODIUM CHLORIDE 0.9 % IV SOLN: Freq: Once | INTRAVENOUS | Status: AC

## 2021-09-09 NOTE — Patient Instructions (Signed)

## 2021-09-13 DIAGNOSIS — M14671 Charcot's joint, right ankle and foot: Secondary | ICD-10-CM | POA: Diagnosis not present

## 2021-09-13 DIAGNOSIS — L97522 Non-pressure chronic ulcer of other part of left foot with fat layer exposed: Secondary | ICD-10-CM | POA: Diagnosis not present

## 2021-09-14 DIAGNOSIS — L97422 Non-pressure chronic ulcer of left heel and midfoot with fat layer exposed: Secondary | ICD-10-CM | POA: Diagnosis not present

## 2021-09-14 DIAGNOSIS — E11621 Type 2 diabetes mellitus with foot ulcer: Secondary | ICD-10-CM | POA: Diagnosis not present

## 2021-09-14 DIAGNOSIS — L97522 Non-pressure chronic ulcer of other part of left foot with fat layer exposed: Secondary | ICD-10-CM | POA: Diagnosis not present

## 2021-09-21 DIAGNOSIS — E11621 Type 2 diabetes mellitus with foot ulcer: Secondary | ICD-10-CM | POA: Diagnosis not present

## 2021-09-21 DIAGNOSIS — L97422 Non-pressure chronic ulcer of left heel and midfoot with fat layer exposed: Secondary | ICD-10-CM | POA: Diagnosis not present

## 2021-09-21 DIAGNOSIS — L97522 Non-pressure chronic ulcer of other part of left foot with fat layer exposed: Secondary | ICD-10-CM | POA: Diagnosis not present

## 2021-09-28 DIAGNOSIS — L97522 Non-pressure chronic ulcer of other part of left foot with fat layer exposed: Secondary | ICD-10-CM | POA: Diagnosis not present

## 2021-09-28 DIAGNOSIS — L97422 Non-pressure chronic ulcer of left heel and midfoot with fat layer exposed: Secondary | ICD-10-CM | POA: Diagnosis not present

## 2021-09-28 DIAGNOSIS — E11621 Type 2 diabetes mellitus with foot ulcer: Secondary | ICD-10-CM | POA: Diagnosis not present

## 2021-10-14 DIAGNOSIS — D649 Anemia, unspecified: Secondary | ICD-10-CM | POA: Diagnosis not present

## 2021-10-14 DIAGNOSIS — R1013 Epigastric pain: Secondary | ICD-10-CM | POA: Diagnosis not present

## 2021-10-17 DIAGNOSIS — D649 Anemia, unspecified: Secondary | ICD-10-CM | POA: Diagnosis not present

## 2021-10-31 DIAGNOSIS — E11621 Type 2 diabetes mellitus with foot ulcer: Secondary | ICD-10-CM | POA: Diagnosis not present

## 2021-10-31 DIAGNOSIS — L97421 Non-pressure chronic ulcer of left heel and midfoot limited to breakdown of skin: Secondary | ICD-10-CM | POA: Diagnosis not present

## 2021-10-31 DIAGNOSIS — L03116 Cellulitis of left lower limb: Secondary | ICD-10-CM | POA: Diagnosis not present

## 2021-10-31 DIAGNOSIS — M6702 Short Achilles tendon (acquired), left ankle: Secondary | ICD-10-CM | POA: Diagnosis not present

## 2021-11-07 DIAGNOSIS — D500 Iron deficiency anemia secondary to blood loss (chronic): Secondary | ICD-10-CM | POA: Diagnosis not present

## 2021-11-07 DIAGNOSIS — R1013 Epigastric pain: Secondary | ICD-10-CM | POA: Diagnosis not present

## 2021-11-07 DIAGNOSIS — D509 Iron deficiency anemia, unspecified: Secondary | ICD-10-CM | POA: Diagnosis not present

## 2021-11-07 DIAGNOSIS — Z1212 Encounter for screening for malignant neoplasm of rectum: Secondary | ICD-10-CM | POA: Diagnosis not present

## 2021-11-09 DIAGNOSIS — L97422 Non-pressure chronic ulcer of left heel and midfoot with fat layer exposed: Secondary | ICD-10-CM | POA: Diagnosis not present

## 2021-11-09 DIAGNOSIS — E11621 Type 2 diabetes mellitus with foot ulcer: Secondary | ICD-10-CM | POA: Diagnosis not present

## 2021-11-09 DIAGNOSIS — Z7985 Long-term (current) use of injectable non-insulin antidiabetic drugs: Secondary | ICD-10-CM | POA: Diagnosis not present

## 2021-11-09 DIAGNOSIS — Z7984 Long term (current) use of oral hypoglycemic drugs: Secondary | ICD-10-CM | POA: Diagnosis not present

## 2021-11-09 DIAGNOSIS — L97522 Non-pressure chronic ulcer of other part of left foot with fat layer exposed: Secondary | ICD-10-CM | POA: Diagnosis not present

## 2021-11-11 DIAGNOSIS — K7689 Other specified diseases of liver: Secondary | ICD-10-CM | POA: Diagnosis not present

## 2021-11-11 DIAGNOSIS — R1013 Epigastric pain: Secondary | ICD-10-CM | POA: Diagnosis not present

## 2021-11-14 DIAGNOSIS — E1159 Type 2 diabetes mellitus with other circulatory complications: Secondary | ICD-10-CM | POA: Diagnosis not present

## 2021-11-14 DIAGNOSIS — F331 Major depressive disorder, recurrent, moderate: Secondary | ICD-10-CM | POA: Diagnosis not present

## 2021-11-14 DIAGNOSIS — E782 Mixed hyperlipidemia: Secondary | ICD-10-CM | POA: Diagnosis not present

## 2021-11-14 DIAGNOSIS — I152 Hypertension secondary to endocrine disorders: Secondary | ICD-10-CM | POA: Diagnosis not present

## 2021-11-16 DIAGNOSIS — L97522 Non-pressure chronic ulcer of other part of left foot with fat layer exposed: Secondary | ICD-10-CM | POA: Diagnosis not present

## 2021-11-16 DIAGNOSIS — L97422 Non-pressure chronic ulcer of left heel and midfoot with fat layer exposed: Secondary | ICD-10-CM | POA: Diagnosis not present

## 2021-11-16 DIAGNOSIS — E11621 Type 2 diabetes mellitus with foot ulcer: Secondary | ICD-10-CM | POA: Diagnosis not present

## 2021-11-21 NOTE — Progress Notes (Signed)
?[facility]  ?[address] ?[place],  [place]  [address] ?[phone] ? ?Clinic Day:  11/22/2021 ? ?Referring physician: Carolee Rota, NP ? ?HISTORY OF PRESENT ILLNESS:  ?The patient is a 41 y.o. female with recurrent iron deficiency anemia.  She comes in today to reassess her iron and hemoglobin levels after receiving IV iron.  Since receiving her iron, the patient has felt better.  However, over the past few days, her ice cravings have mildly returned.  She claims the only overt form of blood loss she has are her menstrual cycles, which last 5-7 days at a time.  However, she denies they have been particularly heavy.  This patient has had a GI work-up, as well as a Pap smear/pelvic exam, for which no adverse pathology was seen. ? ?VITALS:  ?Blood pressure (!) 161/95, pulse 93, temperature 98.2 ?F (36.8 ?C), resp. rate 16, height 5\' 11"  (1.803 m), weight 266 lb (120.7 kg), SpO2 97 %.  ?Wt Readings from Last 3 Encounters:  ?11/22/21 266 lb (120.7 kg)  ?09/06/21 270 lb (122.5 kg)  ?09/02/21 266 lb (120.7 kg)  ?  ?Body mass index is 37.1 kg/m?. ? ?Performance status (ECOG): 1 ? ?PHYSICAL EXAM:  ?Physical Exam ?Vitals and nursing note reviewed.  ?Constitutional:   ?   General: She is not in acute distress. ?   Appearance: Normal appearance.  ?HENT:  ?   Head: Normocephalic and atraumatic.  ?   Mouth/Throat:  ?   Mouth: Mucous membranes are moist.  ?   Pharynx: Oropharynx is clear. No oropharyngeal exudate or posterior oropharyngeal erythema.  ?Eyes:  ?   General: No scleral icterus. ?   Extraocular Movements: Extraocular movements intact.  ?   Conjunctiva/sclera: Conjunctivae normal.  ?   Pupils: Pupils are equal, round, and reactive to light.  ?Cardiovascular:  ?   Rate and Rhythm: Normal rate and regular rhythm.  ?   Heart sounds: Normal heart sounds. No murmur heard. ?  No friction rub. No gallop.  ?Pulmonary:  ?   Effort: Pulmonary effort is normal.  ?   Breath sounds: Normal  breath sounds. No wheezing, rhonchi or rales.  ?Abdominal:  ?   General: There is no distension.  ?   Palpations: Abdomen is soft. There is no hepatomegaly, splenomegaly or mass.  ?   Tenderness: There is no abdominal tenderness.  ?Musculoskeletal:  ?   Right lower leg: No edema.  ?   Left lower leg: No edema.  ?Lymphadenopathy:  ?   Cervical: No cervical adenopathy.  ?   Upper Body:  ?   Right upper body: No supraclavicular or axillary adenopathy.  ?   Left upper body: No supraclavicular or axillary adenopathy.  ?   Lower Body: No right inguinal adenopathy. No left inguinal adenopathy.  ?Skin: ?   General: Skin is warm and dry.  ?   Coloration: Skin is not jaundiced.  ?   Findings: No rash.  ?Neurological:  ?   Mental Status: She is alert and oriented to person, place, and time.  ?   Cranial Nerves: No cranial nerve deficit.  ?Psychiatric:     ?   Mood and Affect: Mood normal.     ?   Behavior: Behavior normal.     ?   Thought Content: Thought content normal.  ? ? ?LABS:  ? ? ? ? Latest Reference Range & Units 11/22/21 08:51  ?Iron 28 - 170 ug/dL 42  ?  UIBC ug/dL 361  ?TIBC 250 - 450 ug/dL 403  ?Saturation Ratios 10.4 - 31.8 % 10 (L)  ?Ferritin 11 - 307 ng/mL 11  ?(L): Data is abnormally low ? ?ASSESSMENT & PLAN:  ?Assessment/plan:  A 41 y.o. female with recurrent iron deficiency of uncertain etiology.  Her labs today show that her hemoglobin has definitely improved since receiving her IV iron.  However, her current iron panel is still suboptimal, especially when considering she just had another course of IV iron.  Based upon this, I will arrange for her to receive another course of IV iron over these next few weeks, especially as she is beginning to have pica-like symptoms with recurrent ice cravings.  I will see her back in 4 months to reassess her iron and hemoglobin levels.  The patient understands all the plans discussed today and is in agreement with them. ? ? ?Anzley Dibbern Macarthur Critchley, MD   ? ? ?   ?

## 2021-11-22 ENCOUNTER — Inpatient Hospital Stay: Payer: BC Managed Care – PPO

## 2021-11-22 ENCOUNTER — Other Ambulatory Visit: Payer: Self-pay

## 2021-11-22 ENCOUNTER — Telehealth: Payer: Self-pay | Admitting: Oncology

## 2021-11-22 ENCOUNTER — Inpatient Hospital Stay: Payer: BC Managed Care – PPO | Attending: Hematology and Oncology | Admitting: Oncology

## 2021-11-22 VITALS — BP 161/95 | HR 93 | Temp 98.2°F | Resp 16 | Ht 71.0 in | Wt 266.0 lb

## 2021-11-22 DIAGNOSIS — D500 Iron deficiency anemia secondary to blood loss (chronic): Secondary | ICD-10-CM | POA: Diagnosis not present

## 2021-11-22 DIAGNOSIS — N920 Excessive and frequent menstruation with regular cycle: Secondary | ICD-10-CM | POA: Insufficient documentation

## 2021-11-22 DIAGNOSIS — D509 Iron deficiency anemia, unspecified: Secondary | ICD-10-CM

## 2021-11-22 LAB — FERRITIN: Ferritin: 11 ng/mL (ref 11–307)

## 2021-11-22 LAB — CBC AND DIFFERENTIAL
HCT: 47 — AB (ref 36–46)
Hemoglobin: 14.8 (ref 12.0–16.0)
Neutrophils Absolute: 5.83
Platelets: 398 10*3/uL (ref 150–400)
WBC: 9.4

## 2021-11-22 LAB — IRON AND TIBC
Iron: 42 ug/dL (ref 28–170)
Saturation Ratios: 10 % — ABNORMAL LOW (ref 10.4–31.8)
TIBC: 403 ug/dL (ref 250–450)
UIBC: 361 ug/dL

## 2021-11-22 LAB — CBC: RBC: 5.7 — AB (ref 3.87–5.11)

## 2021-11-22 NOTE — Telephone Encounter (Signed)
Per 11/22/21 los next appt scheduled and confirmed with patient ?

## 2021-11-23 DIAGNOSIS — L97422 Non-pressure chronic ulcer of left heel and midfoot with fat layer exposed: Secondary | ICD-10-CM | POA: Diagnosis not present

## 2021-11-23 DIAGNOSIS — E11621 Type 2 diabetes mellitus with foot ulcer: Secondary | ICD-10-CM | POA: Diagnosis not present

## 2021-11-23 DIAGNOSIS — L97522 Non-pressure chronic ulcer of other part of left foot with fat layer exposed: Secondary | ICD-10-CM | POA: Diagnosis not present

## 2021-11-24 ENCOUNTER — Other Ambulatory Visit: Payer: Self-pay | Admitting: Pharmacist

## 2021-11-25 ENCOUNTER — Encounter: Payer: Self-pay | Admitting: Oncology

## 2021-11-29 ENCOUNTER — Inpatient Hospital Stay: Payer: BC Managed Care – PPO

## 2021-11-29 ENCOUNTER — Other Ambulatory Visit: Payer: Self-pay

## 2021-11-29 VITALS — BP 140/88 | HR 77 | Temp 98.6°F | Resp 18 | Wt 264.0 lb

## 2021-11-29 DIAGNOSIS — N920 Excessive and frequent menstruation with regular cycle: Secondary | ICD-10-CM | POA: Diagnosis not present

## 2021-11-29 DIAGNOSIS — D509 Iron deficiency anemia, unspecified: Secondary | ICD-10-CM

## 2021-11-29 DIAGNOSIS — D500 Iron deficiency anemia secondary to blood loss (chronic): Secondary | ICD-10-CM | POA: Diagnosis not present

## 2021-11-29 MED ORDER — SODIUM CHLORIDE 0.9 % IV SOLN
200.0000 mg | Freq: Once | INTRAVENOUS | Status: AC
  Administered 2021-11-29: 200 mg via INTRAVENOUS
  Filled 2021-11-29: qty 200

## 2021-11-29 MED ORDER — SODIUM CHLORIDE 0.9 % IV SOLN: Freq: Once | INTRAVENOUS | Status: AC

## 2021-11-29 NOTE — Patient Instructions (Signed)

## 2021-11-30 DIAGNOSIS — E11621 Type 2 diabetes mellitus with foot ulcer: Secondary | ICD-10-CM | POA: Diagnosis not present

## 2021-11-30 DIAGNOSIS — L97422 Non-pressure chronic ulcer of left heel and midfoot with fat layer exposed: Secondary | ICD-10-CM | POA: Diagnosis not present

## 2021-11-30 DIAGNOSIS — L97512 Non-pressure chronic ulcer of other part of right foot with fat layer exposed: Secondary | ICD-10-CM | POA: Diagnosis not present

## 2021-11-30 DIAGNOSIS — E1159 Type 2 diabetes mellitus with other circulatory complications: Secondary | ICD-10-CM | POA: Diagnosis not present

## 2021-11-30 MED FILL — Iron Sucrose Inj 20 MG/ML (Fe Equiv): INTRAVENOUS | Qty: 10 | Status: AC

## 2021-12-01 ENCOUNTER — Encounter: Payer: Self-pay | Admitting: Oncology

## 2021-12-01 ENCOUNTER — Inpatient Hospital Stay: Payer: BC Managed Care – PPO

## 2021-12-01 VITALS — BP 142/79 | HR 86 | Temp 98.8°F | Resp 18 | Wt 260.0 lb

## 2021-12-01 DIAGNOSIS — D509 Iron deficiency anemia, unspecified: Secondary | ICD-10-CM

## 2021-12-01 DIAGNOSIS — N920 Excessive and frequent menstruation with regular cycle: Secondary | ICD-10-CM | POA: Diagnosis not present

## 2021-12-01 DIAGNOSIS — D500 Iron deficiency anemia secondary to blood loss (chronic): Secondary | ICD-10-CM | POA: Diagnosis not present

## 2021-12-01 MED ORDER — SODIUM CHLORIDE 0.9 % IV SOLN: Freq: Once | INTRAVENOUS | Status: AC

## 2021-12-01 MED ORDER — SODIUM CHLORIDE 0.9 % IV SOLN
200.0000 mg | Freq: Once | INTRAVENOUS | Status: AC
  Administered 2021-12-01: 200 mg via INTRAVENOUS
  Filled 2021-12-01: qty 10

## 2021-12-01 NOTE — Patient Instructions (Signed)

## 2021-12-02 ENCOUNTER — Inpatient Hospital Stay: Payer: BC Managed Care – PPO

## 2021-12-02 VITALS — BP 141/92 | HR 73 | Temp 98.9°F | Resp 20

## 2021-12-02 DIAGNOSIS — D500 Iron deficiency anemia secondary to blood loss (chronic): Secondary | ICD-10-CM | POA: Diagnosis not present

## 2021-12-02 DIAGNOSIS — D509 Iron deficiency anemia, unspecified: Secondary | ICD-10-CM

## 2021-12-02 DIAGNOSIS — N920 Excessive and frequent menstruation with regular cycle: Secondary | ICD-10-CM | POA: Diagnosis not present

## 2021-12-02 MED ORDER — SODIUM CHLORIDE 0.9 % IV SOLN
200.0000 mg | Freq: Once | INTRAVENOUS | Status: AC
  Administered 2021-12-02: 200 mg via INTRAVENOUS
  Filled 2021-12-02: qty 10

## 2021-12-02 MED ORDER — SODIUM CHLORIDE 0.9 % IV SOLN: Freq: Once | INTRAVENOUS | Status: AC

## 2021-12-02 NOTE — Patient Instructions (Signed)

## 2021-12-06 ENCOUNTER — Inpatient Hospital Stay: Payer: BC Managed Care – PPO | Attending: Hematology and Oncology

## 2021-12-06 VITALS — BP 141/71 | HR 87 | Temp 98.1°F | Resp 18

## 2021-12-06 DIAGNOSIS — D509 Iron deficiency anemia, unspecified: Secondary | ICD-10-CM

## 2021-12-06 DIAGNOSIS — N920 Excessive and frequent menstruation with regular cycle: Secondary | ICD-10-CM | POA: Insufficient documentation

## 2021-12-06 DIAGNOSIS — D500 Iron deficiency anemia secondary to blood loss (chronic): Secondary | ICD-10-CM | POA: Insufficient documentation

## 2021-12-06 MED ORDER — SODIUM CHLORIDE 0.9 % IV SOLN: Freq: Once | INTRAVENOUS | Status: AC

## 2021-12-06 MED ORDER — SODIUM CHLORIDE 0.9 % IV SOLN
200.0000 mg | Freq: Once | INTRAVENOUS | Status: AC
  Administered 2021-12-06: 200 mg via INTRAVENOUS
  Filled 2021-12-06: qty 200

## 2021-12-06 NOTE — Patient Instructions (Signed)

## 2021-12-07 ENCOUNTER — Ambulatory Visit: Payer: BC Managed Care – PPO

## 2021-12-07 DIAGNOSIS — L97422 Non-pressure chronic ulcer of left heel and midfoot with fat layer exposed: Secondary | ICD-10-CM | POA: Diagnosis not present

## 2021-12-07 DIAGNOSIS — E11621 Type 2 diabetes mellitus with foot ulcer: Secondary | ICD-10-CM | POA: Diagnosis not present

## 2021-12-07 MED FILL — Iron Sucrose Inj 20 MG/ML (Fe Equiv): INTRAVENOUS | Qty: 10 | Status: AC

## 2021-12-08 DIAGNOSIS — D509 Iron deficiency anemia, unspecified: Secondary | ICD-10-CM | POA: Diagnosis not present

## 2021-12-09 ENCOUNTER — Other Ambulatory Visit: Payer: Self-pay | Admitting: Pharmacist

## 2021-12-09 DIAGNOSIS — R1013 Epigastric pain: Secondary | ICD-10-CM | POA: Diagnosis not present

## 2021-12-09 DIAGNOSIS — D649 Anemia, unspecified: Secondary | ICD-10-CM | POA: Diagnosis not present

## 2021-12-09 DIAGNOSIS — K760 Fatty (change of) liver, not elsewhere classified: Secondary | ICD-10-CM | POA: Diagnosis not present

## 2021-12-12 ENCOUNTER — Inpatient Hospital Stay: Payer: BC Managed Care – PPO

## 2021-12-12 VITALS — BP 141/69 | HR 88 | Temp 97.9°F | Resp 18

## 2021-12-12 DIAGNOSIS — N920 Excessive and frequent menstruation with regular cycle: Secondary | ICD-10-CM | POA: Diagnosis not present

## 2021-12-12 DIAGNOSIS — D500 Iron deficiency anemia secondary to blood loss (chronic): Secondary | ICD-10-CM | POA: Diagnosis not present

## 2021-12-12 DIAGNOSIS — D509 Iron deficiency anemia, unspecified: Secondary | ICD-10-CM

## 2021-12-12 MED ORDER — SODIUM CHLORIDE 0.9 % IV SOLN: Freq: Once | INTRAVENOUS | Status: AC

## 2021-12-12 MED ORDER — SODIUM CHLORIDE 0.9 % IV SOLN
200.0000 mg | Freq: Once | INTRAVENOUS | Status: AC
  Administered 2021-12-12: 200 mg via INTRAVENOUS
  Filled 2021-12-12: qty 200

## 2021-12-12 NOTE — Patient Instructions (Signed)

## 2021-12-14 DIAGNOSIS — E11621 Type 2 diabetes mellitus with foot ulcer: Secondary | ICD-10-CM | POA: Diagnosis not present

## 2021-12-14 DIAGNOSIS — L97522 Non-pressure chronic ulcer of other part of left foot with fat layer exposed: Secondary | ICD-10-CM | POA: Diagnosis not present

## 2021-12-21 DIAGNOSIS — L03116 Cellulitis of left lower limb: Secondary | ICD-10-CM | POA: Diagnosis not present

## 2021-12-21 DIAGNOSIS — E11621 Type 2 diabetes mellitus with foot ulcer: Secondary | ICD-10-CM | POA: Diagnosis not present

## 2021-12-21 DIAGNOSIS — L97422 Non-pressure chronic ulcer of left heel and midfoot with fat layer exposed: Secondary | ICD-10-CM | POA: Diagnosis not present

## 2021-12-21 DIAGNOSIS — L97522 Non-pressure chronic ulcer of other part of left foot with fat layer exposed: Secondary | ICD-10-CM | POA: Diagnosis not present

## 2021-12-28 DIAGNOSIS — E11621 Type 2 diabetes mellitus with foot ulcer: Secondary | ICD-10-CM | POA: Diagnosis not present

## 2021-12-28 DIAGNOSIS — L97422 Non-pressure chronic ulcer of left heel and midfoot with fat layer exposed: Secondary | ICD-10-CM | POA: Diagnosis not present

## 2022-01-05 DIAGNOSIS — L97422 Non-pressure chronic ulcer of left heel and midfoot with fat layer exposed: Secondary | ICD-10-CM | POA: Diagnosis not present

## 2022-01-05 DIAGNOSIS — Z7985 Long-term (current) use of injectable non-insulin antidiabetic drugs: Secondary | ICD-10-CM | POA: Diagnosis not present

## 2022-01-05 DIAGNOSIS — L97522 Non-pressure chronic ulcer of other part of left foot with fat layer exposed: Secondary | ICD-10-CM | POA: Diagnosis not present

## 2022-01-05 DIAGNOSIS — Z7984 Long term (current) use of oral hypoglycemic drugs: Secondary | ICD-10-CM | POA: Diagnosis not present

## 2022-01-05 DIAGNOSIS — E11621 Type 2 diabetes mellitus with foot ulcer: Secondary | ICD-10-CM | POA: Diagnosis not present

## 2022-01-18 DIAGNOSIS — L97522 Non-pressure chronic ulcer of other part of left foot with fat layer exposed: Secondary | ICD-10-CM | POA: Diagnosis not present

## 2022-01-18 DIAGNOSIS — L97422 Non-pressure chronic ulcer of left heel and midfoot with fat layer exposed: Secondary | ICD-10-CM | POA: Diagnosis not present

## 2022-01-18 DIAGNOSIS — E11621 Type 2 diabetes mellitus with foot ulcer: Secondary | ICD-10-CM | POA: Diagnosis not present

## 2022-01-25 DIAGNOSIS — L97522 Non-pressure chronic ulcer of other part of left foot with fat layer exposed: Secondary | ICD-10-CM | POA: Diagnosis not present

## 2022-01-25 DIAGNOSIS — E11621 Type 2 diabetes mellitus with foot ulcer: Secondary | ICD-10-CM | POA: Diagnosis not present

## 2022-01-25 DIAGNOSIS — L97422 Non-pressure chronic ulcer of left heel and midfoot with fat layer exposed: Secondary | ICD-10-CM | POA: Diagnosis not present

## 2022-02-01 DIAGNOSIS — L97422 Non-pressure chronic ulcer of left heel and midfoot with fat layer exposed: Secondary | ICD-10-CM | POA: Diagnosis not present

## 2022-02-01 DIAGNOSIS — E11621 Type 2 diabetes mellitus with foot ulcer: Secondary | ICD-10-CM | POA: Diagnosis not present

## 2022-02-01 DIAGNOSIS — L02612 Cutaneous abscess of left foot: Secondary | ICD-10-CM | POA: Diagnosis not present

## 2022-02-01 DIAGNOSIS — L97522 Non-pressure chronic ulcer of other part of left foot with fat layer exposed: Secondary | ICD-10-CM | POA: Diagnosis not present

## 2022-02-02 DIAGNOSIS — L97509 Non-pressure chronic ulcer of other part of unspecified foot with unspecified severity: Secondary | ICD-10-CM | POA: Diagnosis not present

## 2022-02-08 DIAGNOSIS — E872 Acidosis, unspecified: Secondary | ICD-10-CM | POA: Diagnosis not present

## 2022-02-08 DIAGNOSIS — E559 Vitamin D deficiency, unspecified: Secondary | ICD-10-CM | POA: Diagnosis not present

## 2022-02-08 DIAGNOSIS — L97422 Non-pressure chronic ulcer of left heel and midfoot with fat layer exposed: Secondary | ICD-10-CM | POA: Diagnosis not present

## 2022-02-08 DIAGNOSIS — L98499 Non-pressure chronic ulcer of skin of other sites with unspecified severity: Secondary | ICD-10-CM | POA: Diagnosis not present

## 2022-02-08 DIAGNOSIS — Z8249 Family history of ischemic heart disease and other diseases of the circulatory system: Secondary | ICD-10-CM | POA: Diagnosis not present

## 2022-02-08 DIAGNOSIS — B9561 Methicillin susceptible Staphylococcus aureus infection as the cause of diseases classified elsewhere: Secondary | ICD-10-CM | POA: Diagnosis not present

## 2022-02-08 DIAGNOSIS — M7989 Other specified soft tissue disorders: Secondary | ICD-10-CM | POA: Diagnosis not present

## 2022-02-08 DIAGNOSIS — L97522 Non-pressure chronic ulcer of other part of left foot with fat layer exposed: Secondary | ICD-10-CM | POA: Diagnosis not present

## 2022-02-08 DIAGNOSIS — I10 Essential (primary) hypertension: Secondary | ICD-10-CM | POA: Diagnosis not present

## 2022-02-08 DIAGNOSIS — E785 Hyperlipidemia, unspecified: Secondary | ICD-10-CM | POA: Diagnosis not present

## 2022-02-08 DIAGNOSIS — E11621 Type 2 diabetes mellitus with foot ulcer: Secondary | ICD-10-CM | POA: Diagnosis not present

## 2022-02-08 DIAGNOSIS — I96 Gangrene, not elsewhere classified: Secondary | ICD-10-CM | POA: Diagnosis not present

## 2022-02-08 DIAGNOSIS — B955 Unspecified streptococcus as the cause of diseases classified elsewhere: Secondary | ICD-10-CM | POA: Diagnosis not present

## 2022-02-08 DIAGNOSIS — S91302A Unspecified open wound, left foot, initial encounter: Secondary | ICD-10-CM | POA: Diagnosis not present

## 2022-02-08 DIAGNOSIS — E1152 Type 2 diabetes mellitus with diabetic peripheral angiopathy with gangrene: Secondary | ICD-10-CM | POA: Diagnosis not present

## 2022-02-08 DIAGNOSIS — S91102A Unspecified open wound of left great toe without damage to nail, initial encounter: Secondary | ICD-10-CM | POA: Diagnosis not present

## 2022-02-08 DIAGNOSIS — M86172 Other acute osteomyelitis, left ankle and foot: Secondary | ICD-10-CM | POA: Diagnosis not present

## 2022-02-08 DIAGNOSIS — L97529 Non-pressure chronic ulcer of other part of left foot with unspecified severity: Secondary | ICD-10-CM | POA: Diagnosis not present

## 2022-02-08 DIAGNOSIS — D72829 Elevated white blood cell count, unspecified: Secondary | ICD-10-CM | POA: Diagnosis not present

## 2022-02-08 DIAGNOSIS — L03116 Cellulitis of left lower limb: Secondary | ICD-10-CM | POA: Diagnosis not present

## 2022-02-08 DIAGNOSIS — S92323A Displaced fracture of second metatarsal bone, unspecified foot, initial encounter for closed fracture: Secondary | ICD-10-CM | POA: Diagnosis not present

## 2022-02-08 DIAGNOSIS — M868X7 Other osteomyelitis, ankle and foot: Secondary | ICD-10-CM | POA: Diagnosis not present

## 2022-02-08 DIAGNOSIS — E11628 Type 2 diabetes mellitus with other skin complications: Secondary | ICD-10-CM | POA: Diagnosis not present

## 2022-02-08 DIAGNOSIS — E1169 Type 2 diabetes mellitus with other specified complication: Secondary | ICD-10-CM | POA: Diagnosis not present

## 2022-02-08 DIAGNOSIS — Z9889 Other specified postprocedural states: Secondary | ICD-10-CM | POA: Diagnosis not present

## 2022-02-08 DIAGNOSIS — M6702 Short Achilles tendon (acquired), left ankle: Secondary | ICD-10-CM | POA: Diagnosis not present

## 2022-02-08 DIAGNOSIS — E11610 Type 2 diabetes mellitus with diabetic neuropathic arthropathy: Secondary | ICD-10-CM | POA: Diagnosis not present

## 2022-02-08 DIAGNOSIS — D509 Iron deficiency anemia, unspecified: Secondary | ICD-10-CM | POA: Diagnosis not present

## 2022-02-08 DIAGNOSIS — E1159 Type 2 diabetes mellitus with other circulatory complications: Secondary | ICD-10-CM | POA: Diagnosis not present

## 2022-03-02 DIAGNOSIS — Z9889 Other specified postprocedural states: Secondary | ICD-10-CM | POA: Diagnosis not present

## 2022-03-02 DIAGNOSIS — Z792 Long term (current) use of antibiotics: Secondary | ICD-10-CM | POA: Diagnosis not present

## 2022-03-02 DIAGNOSIS — M868X7 Other osteomyelitis, ankle and foot: Secondary | ICD-10-CM | POA: Diagnosis not present

## 2022-03-02 DIAGNOSIS — L03115 Cellulitis of right lower limb: Secondary | ICD-10-CM | POA: Diagnosis not present

## 2022-03-02 DIAGNOSIS — Z86718 Personal history of other venous thrombosis and embolism: Secondary | ICD-10-CM | POA: Diagnosis not present

## 2022-03-02 DIAGNOSIS — L89899 Pressure ulcer of other site, unspecified stage: Secondary | ICD-10-CM | POA: Diagnosis not present

## 2022-03-02 DIAGNOSIS — M869 Osteomyelitis, unspecified: Secondary | ICD-10-CM | POA: Diagnosis not present

## 2022-03-03 DIAGNOSIS — F419 Anxiety disorder, unspecified: Secondary | ICD-10-CM | POA: Diagnosis not present

## 2022-03-03 DIAGNOSIS — Z6836 Body mass index (BMI) 36.0-36.9, adult: Secondary | ICD-10-CM | POA: Diagnosis not present

## 2022-03-03 DIAGNOSIS — M869 Osteomyelitis, unspecified: Secondary | ICD-10-CM | POA: Diagnosis not present

## 2022-03-13 DIAGNOSIS — M869 Osteomyelitis, unspecified: Secondary | ICD-10-CM | POA: Diagnosis not present

## 2022-03-14 DIAGNOSIS — L97522 Non-pressure chronic ulcer of other part of left foot with fat layer exposed: Secondary | ICD-10-CM | POA: Diagnosis not present

## 2022-03-23 NOTE — Progress Notes (Signed)
[facility]  [address] [place],  [place]  [address] [phone]  Clinic Day:  03/24/2022  Referring physician: [name], NP  HISTORY OF PRESENT ILLNESS:  The patient is a 41 y.o. female with recurrent iron deficiency anemia.  She comes in today to reassess her iron and hemoglobin levels after receiving another course of IV iron.  Since receiving her iron, the patient has felt somewhat better.  She claims the only overt form of blood loss she has are her menstrual cycles, which have not been particularly heavy.  The patient claims of had another gynecologic exam since her last visit, which came back normal.  VITALS:  Blood pressure (!) 181/106, pulse (!) 102, temperature 99.2 F (37.3 C), resp. rate 16, height 5\' 11"  (1.803 m), weight 264 lb 4.8 oz (119.9 kg), SpO2 96 %.  Wt Readings from Last 3 Encounters:  03/24/22 264 lb 4.8 oz (119.9 kg)  12/01/21 260 lb (117.9 kg)  11/29/21 264 lb (119.7 kg)    Body mass index is 36.86 kg/m.  Performance status (ECOG): 1  PHYSICAL EXAM:  Physical Exam Vitals and nursing note reviewed.  Constitutional:      General: She is not in acute distress.    Appearance: Normal appearance.  HENT:     Head: Normocephalic and atraumatic.     Mouth/Throat:     Mouth: Mucous membranes are moist.     Pharynx: Oropharynx is clear. No oropharyngeal exudate or posterior oropharyngeal erythema.  Eyes:     General: No scleral icterus.    Extraocular Movements: Extraocular movements intact.     Conjunctiva/sclera: Conjunctivae normal.     Pupils: Pupils are equal, round, and reactive to light.  Cardiovascular:     Rate and Rhythm: Normal rate and regular rhythm.     Heart sounds: Normal heart sounds. No murmur heard.    No friction rub. No gallop.  Pulmonary:     Effort: Pulmonary effort is normal.     Breath sounds: Normal breath sounds. No wheezing, rhonchi or rales.  Abdominal:     General: There is no  distension.     Palpations: Abdomen is soft. There is no hepatomegaly, splenomegaly or mass.     Tenderness: There is no abdominal tenderness.  Musculoskeletal:     Right lower leg: No edema.     Left lower leg: No edema.  Lymphadenopathy:     Cervical: No cervical adenopathy.     Upper Body:     Right upper body: No supraclavicular or axillary adenopathy.     Left upper body: No supraclavicular or axillary adenopathy.     Lower Body: No right inguinal adenopathy. No left inguinal adenopathy.  Skin:    General: Skin is warm and dry.     Coloration: Skin is not jaundiced.     Findings: No rash.  Neurological:     Mental Status: She is alert and oriented to person, place, and time.     Cranial Nerves: No cranial nerve deficit.  Psychiatric:        Mood and Affect: Mood normal.        Behavior: Behavior normal.        Thought Content: Thought content normal.    LABS:     Latest Reference Range & Units Most Recent 11/22/21 08:51  Iron 28 - 170 ug/dL 69 11/24/21 1/76/16 42  UIBC ug/dL 07:37 106 2/69/48 54:62  TIBC 250 - 450 ug/dL 703 500  09:21 403  Saturation Ratios 10.4 - 31.8 % 18 03/24/22 09:21 10 (L)  Ferritin 11 - 307 ng/mL 50 03/24/22 09:21 11  (L): Data is abnormally low  ASSESSMENT & PLAN:  Assessment/plan:  A 41 y.o. female with recurrent iron deficiency anemia.  I am very pleased with the improvement in both her iron and hemoglobin levels since receiving her second course of IV iron.  Clinically, the patient appears to be doing well.  As that is the case, I will see her back in 6 months for repeat clinical assessment.  The patient understands all the plans discussed today and is in agreement with them.   Becka Lagasse Kirby Funk, MD

## 2022-03-24 ENCOUNTER — Inpatient Hospital Stay: Payer: BC Managed Care – PPO | Attending: Oncology | Admitting: Oncology

## 2022-03-24 ENCOUNTER — Inpatient Hospital Stay: Payer: BC Managed Care – PPO

## 2022-03-24 VITALS — BP 181/106 | HR 102 | Temp 99.2°F | Resp 16 | Ht 71.0 in | Wt 264.3 lb

## 2022-03-24 DIAGNOSIS — D509 Iron deficiency anemia, unspecified: Secondary | ICD-10-CM

## 2022-03-24 LAB — FERRITIN: Ferritin: 50 ng/mL (ref 11–307)

## 2022-03-24 LAB — IRON AND TIBC
Iron: 69 ug/dL (ref 28–170)
Saturation Ratios: 18 % (ref 10.4–31.8)
TIBC: 375 ug/dL (ref 250–450)
UIBC: 306 ug/dL

## 2022-03-24 LAB — CBC AND DIFFERENTIAL
HCT: 47 — AB (ref 36–46)
Hemoglobin: 15.7 (ref 12.0–16.0)
Neutrophils Absolute: 5.54
Platelets: 316 10*3/uL (ref 150–400)
WBC: 8.4

## 2022-03-24 LAB — CBC: RBC: 5.22 — AB (ref 3.87–5.11)

## 2022-03-28 DIAGNOSIS — L97522 Non-pressure chronic ulcer of other part of left foot with fat layer exposed: Secondary | ICD-10-CM | POA: Diagnosis not present

## 2022-04-11 ENCOUNTER — Encounter: Payer: Self-pay | Admitting: Oncology

## 2022-04-18 DIAGNOSIS — L97522 Non-pressure chronic ulcer of other part of left foot with fat layer exposed: Secondary | ICD-10-CM | POA: Diagnosis not present

## 2022-05-17 DIAGNOSIS — E11621 Type 2 diabetes mellitus with foot ulcer: Secondary | ICD-10-CM | POA: Diagnosis not present

## 2022-05-17 DIAGNOSIS — L97522 Non-pressure chronic ulcer of other part of left foot with fat layer exposed: Secondary | ICD-10-CM | POA: Diagnosis not present

## 2022-05-17 DIAGNOSIS — L97422 Non-pressure chronic ulcer of left heel and midfoot with fat layer exposed: Secondary | ICD-10-CM | POA: Diagnosis not present

## 2022-05-23 DIAGNOSIS — Z89422 Acquired absence of other left toe(s): Secondary | ICD-10-CM | POA: Diagnosis not present

## 2022-05-23 DIAGNOSIS — L97522 Non-pressure chronic ulcer of other part of left foot with fat layer exposed: Secondary | ICD-10-CM | POA: Diagnosis not present

## 2022-05-26 DIAGNOSIS — E782 Mixed hyperlipidemia: Secondary | ICD-10-CM | POA: Diagnosis not present

## 2022-05-26 DIAGNOSIS — I152 Hypertension secondary to endocrine disorders: Secondary | ICD-10-CM | POA: Diagnosis not present

## 2022-05-26 DIAGNOSIS — E1159 Type 2 diabetes mellitus with other circulatory complications: Secondary | ICD-10-CM | POA: Diagnosis not present

## 2022-05-26 DIAGNOSIS — K219 Gastro-esophageal reflux disease without esophagitis: Secondary | ICD-10-CM | POA: Diagnosis not present

## 2022-06-29 ENCOUNTER — Ambulatory Visit: Payer: Self-pay | Admitting: Licensed Clinical Social Worker

## 2022-06-29 NOTE — Patient Outreach (Signed)
  Care Coordination   Initial Visit Note   06/29/2022 Name: Faatimah Spielberg MRN: [id number] DOB: Apr 27, 1981  Renita Brocks is a 41 y.o. year old female who sees Carolee Rota, NP for primary care. I spoke with  Merilynn Finland by phone today.  What matters to the patients health and wellness today?  Care Coordination    Goals Addressed               This Visit's Progress     Care Coordination Activities (pt-stated)        Care Coordination Interventions: Discussed plans with patient for ongoing care management follow up and provided patient with direct contact information for care management team Assessed social determinant of health barriers  Active listening / Reflection utilized  Emotional Support Provided         SDOH assessments and interventions completed:  Yes     Care Coordination Interventions Activated:  Yes  Care Coordination Interventions:  Yes, provided   Follow up plan: No further intervention required.   Encounter Outcome:  Pt. Visit Completed

## 2022-06-29 NOTE — Patient Instructions (Signed)
Visit Information  Thank you for taking time to visit with me today. Please don't hesitate to contact me if I can be of assistance to you.   Following are the goals we discussed today:   Goals Addressed               This Visit's Progress     Care Coordination Activities (pt-stated)        Care Coordination Interventions: Discussed plans with patient for ongoing care management follow up and provided patient with direct contact information for care management team Assessed social determinant of health barriers  Active listening / Reflection utilized  Emotional Support Provided           Patient verbalizes understanding of instructions and care plan provided today and agrees to view in [place]. Active MyChart status and patient understanding of how to access instructions and care plan via MyChart confirmed with patient.     No further follow up required: .  Milus Height, Arita Miss, MSW, [place]  Social Worker [facility]  [phone]

## 2022-09-15 ENCOUNTER — Encounter: Payer: Self-pay | Admitting: Oncology

## 2022-09-22 ENCOUNTER — Inpatient Hospital Stay: Payer: No Typology Code available for payment source | Attending: Oncology

## 2022-09-22 ENCOUNTER — Encounter: Payer: Self-pay | Admitting: Oncology

## 2022-09-22 DIAGNOSIS — D509 Iron deficiency anemia, unspecified: Secondary | ICD-10-CM | POA: Diagnosis present

## 2022-09-22 LAB — CBC WITH DIFFERENTIAL (CANCER CENTER ONLY)
Abs Immature Granulocytes: 0.09 10*3/uL — ABNORMAL HIGH (ref 0.00–0.07)
Basophils Absolute: 0.1 10*3/uL (ref 0.0–0.1)
Basophils Relative: 1 %
Eosinophils Absolute: 0.1 10*3/uL (ref 0.0–0.5)
Eosinophils Relative: 1 %
HCT: 49.4 % — ABNORMAL HIGH (ref 36.0–46.0)
Hemoglobin: 15.5 g/dL — ABNORMAL HIGH (ref 12.0–15.0)
Immature Granulocytes: 1 %
Lymphocytes Relative: 24 %
Lymphs Abs: 1.9 10*3/uL (ref 0.7–4.0)
MCH: 27.7 pg (ref 26.0–34.0)
MCHC: 31.4 g/dL (ref 30.0–36.0)
MCV: 88.2 fL (ref 80.0–100.0)
Monocytes Absolute: 0.5 10*3/uL (ref 0.1–1.0)
Monocytes Relative: 7 %
Neutro Abs: 5.1 10*3/uL (ref 1.7–7.7)
Neutrophils Relative %: 66 %
Platelet Count: 414 10*3/uL — ABNORMAL HIGH (ref 150–400)
RBC: 5.6 MIL/uL — ABNORMAL HIGH (ref 3.87–5.11)
RDW: 17 % — ABNORMAL HIGH (ref 11.5–15.5)
WBC Count: 7.8 10*3/uL (ref 4.0–10.5)
nRBC: 0 % (ref 0.0–0.2)

## 2022-09-22 LAB — IRON AND TIBC
Iron: 71 ug/dL (ref 28–170)
Saturation Ratios: 17 % (ref 10.4–31.8)
TIBC: 424 ug/dL (ref 250–450)
UIBC: 353 ug/dL

## 2022-09-22 LAB — FERRITIN: Ferritin: 37 ng/mL (ref 11–307)

## 2022-09-24 NOTE — Progress Notes (Signed)
[facility]  [address] [place],  [place]  [address] [phone]  Clinic Day:  09/25/2022  Referring physician: Carolee Rota, NP  HISTORY OF PRESENT ILLNESS:  The patient is a 42 y.o. female with recurrent iron deficiency anemia.  In the past, IV iron was effective in replenishing her iron stores and improving her hemoglobin.  She comes in today to reassess her iron and hemoglobin levels. Since her last visit, the patient has been doing okay.  She claims the only overt form of blood loss she has had are her menstrual cycles, which have not been particularly heavy.  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    VITALS:  Blood pressure (!) 174/93, pulse (!) 101, temperature 99 F (37.2 C), resp. rate 16, height 5\' 11"  (1.803 m), weight 253 lb 3.2 oz (114.9 kg), SpO2 99 %.  Wt Readings from Last 3 Encounters:  09/25/22 253 lb 3.2 oz (114.9 kg)  03/24/22 264 lb 4.8 oz (119.9 kg)  12/01/21 260 lb (117.9 kg)    Body mass index is 35.31 kg/m.  Performance status (ECOG): 1  PHYSICAL EXAM:  Physical Exam Vitals and nursing note reviewed.  Constitutional:      General: She is not in acute distress.    Appearance: Normal appearance.  HENT:     Head: Normocephalic and atraumatic.     Mouth/Throat:     Mouth: Mucous membranes are moist.     Pharynx: Oropharynx is clear. No oropharyngeal exudate or posterior oropharyngeal erythema.  Eyes:     General: No scleral icterus.    Extraocular Movements: Extraocular movements intact.     Conjunctiva/sclera: Conjunctivae normal.     Pupils: Pupils are equal, round, and reactive to light.  Cardiovascular:     Rate and Rhythm: Normal rate and regular rhythm.     Heart sounds: Normal heart sounds. No murmur heard.    No friction rub. No gallop.  Pulmonary:     Effort: Pulmonary effort is normal.     Breath sounds: Normal breath sounds. No wheezing, rhonchi or  rales.  Abdominal:     General: There is no distension.     Palpations: Abdomen is soft. There is no hepatomegaly, splenomegaly or mass.     Tenderness: There is no abdominal tenderness.  Musculoskeletal:     Right lower leg: No edema.     Left lower leg: No edema.  Lymphadenopathy:     Cervical: No cervical adenopathy.     Upper Body:     Right upper body: No supraclavicular or axillary adenopathy.     Left upper body: No supraclavicular or axillary adenopathy.     Lower Body: No right inguinal adenopathy. No left inguinal adenopathy.  Skin:    General: Skin is warm and dry.     Coloration: Skin is not jaundiced.     Findings: No rash.  Neurological:     Mental Status: She is alert and oriented to person, place, and time.     Cranial Nerves: No cranial nerve deficit.  Psychiatric:        Mood and Affect: Mood normal.        Behavior: Behavior normal.        Thought Content: Thought content normal.     LABS:    Latest Reference Range & Units 09/22/22 08:56  WBC 4.0 - 10.5 K/uL 7.8  RBC 3.87 - 5.11  MIL/uL 5.60 (H)  Hemoglobin 12.0 - 15.0 g/dL 15.5 (H)  HCT 36.0 - 46.0 % 49.4 (H)  MCV 80.0 - 100.0 fL 88.2  MCH 26.0 - 34.0 pg 27.7  MCHC 30.0 - 36.0 g/dL 31.4  RDW 11.5 - 15.5 % 17.0 (H)  Platelets 150 - 400 K/uL 414 (H)  nRBC 0.0 - 0.2 % 0.0  Neutrophils % 66  Lymphocytes % 24  Monocytes Relative % 7  Eosinophil % 1  Basophil % 1  Immature Granulocytes % 1  (H): Data is abnormally high  Latest Reference Range & Units 09/22/22 08:56  Iron 28 - 170 ug/dL 71  UIBC ug/dL 353  TIBC 250 - 450 ug/dL 424  Saturation Ratios 10.4 - 31.8 % 17  Ferritin 11 - 307 ng/mL 37   ASSESSMENT & PLAN:  Assessment/plan:  A 42 y.o. female with recurrent iron deficiency anemia.  I am very pleased her iron and hemoglobin levels have remained ideal over these past months.  Clinically, the patient appears to be doing well.  As that is the case, I will see her back in 6 months for repeat  clinical assessment.  If her lab results remain ideal at that time, I will consider turning her care back over to her other physicians after her next visit.  The patient understands all the plans discussed today and is in agreement with them.   Jazzalynn Rhudy Macarthur Critchley, MD

## 2022-09-25 ENCOUNTER — Other Ambulatory Visit: Payer: Self-pay | Admitting: Oncology

## 2022-09-25 ENCOUNTER — Telehealth: Payer: Self-pay | Admitting: Oncology

## 2022-09-25 ENCOUNTER — Inpatient Hospital Stay (INDEPENDENT_AMBULATORY_CARE_PROVIDER_SITE_OTHER): Payer: No Typology Code available for payment source | Admitting: Oncology

## 2022-09-25 VITALS — BP 174/93 | HR 101 | Temp 99.0°F | Resp 16 | Ht 71.0 in | Wt 253.2 lb

## 2022-09-25 DIAGNOSIS — D509 Iron deficiency anemia, unspecified: Secondary | ICD-10-CM

## 2022-09-25 DIAGNOSIS — D508 Other iron deficiency anemias: Secondary | ICD-10-CM

## 2022-09-25 NOTE — Telephone Encounter (Signed)
Patient has been scheduled for follow-up visit per 09/25/22 LOS.  Pt given an appt calendar with date and time.  

## 2023-03-23 ENCOUNTER — Inpatient Hospital Stay: Payer: No Typology Code available for payment source | Attending: Oncology

## 2023-03-23 ENCOUNTER — Inpatient Hospital Stay: Payer: No Typology Code available for payment source

## 2023-03-23 DIAGNOSIS — D509 Iron deficiency anemia, unspecified: Secondary | ICD-10-CM | POA: Diagnosis present

## 2023-03-23 LAB — CBC WITH DIFFERENTIAL (CANCER CENTER ONLY)
Abs Immature Granulocytes: 0.06 10*3/uL (ref 0.00–0.07)
Basophils Absolute: 0.1 10*3/uL (ref 0.0–0.1)
Basophils Relative: 1 %
Eosinophils Absolute: 0.1 10*3/uL (ref 0.0–0.5)
Eosinophils Relative: 0 %
HCT: 43.5 % (ref 36.0–46.0)
Hemoglobin: 13.8 g/dL (ref 12.0–15.0)
Immature Granulocytes: 1 %
Lymphocytes Relative: 21 %
Lymphs Abs: 2.5 10*3/uL (ref 0.7–4.0)
MCH: 26 pg (ref 26.0–34.0)
MCHC: 31.7 g/dL (ref 30.0–36.0)
MCV: 82.1 fL (ref 80.0–100.0)
Monocytes Absolute: 0.6 10*3/uL (ref 0.1–1.0)
Monocytes Relative: 5 %
Neutro Abs: 9.1 10*3/uL — ABNORMAL HIGH (ref 1.7–7.7)
Neutrophils Relative %: 72 %
Platelet Count: 393 10*3/uL (ref 150–400)
RBC: 5.3 MIL/uL — ABNORMAL HIGH (ref 3.87–5.11)
RDW: 16.5 % — ABNORMAL HIGH (ref 11.5–15.5)
WBC Count: 12.4 10*3/uL — ABNORMAL HIGH (ref 4.0–10.5)
nRBC: 0 % (ref 0.0–0.2)

## 2023-03-23 LAB — IRON AND TIBC
Iron: 46 ug/dL (ref 28–170)
Saturation Ratios: 12 % (ref 10.4–31.8)
TIBC: 386 ug/dL (ref 250–450)
UIBC: 340 ug/dL

## 2023-03-23 LAB — FERRITIN: Ferritin: 11 ng/mL (ref 11–307)

## 2023-03-25 NOTE — Progress Notes (Deleted)
[facility]  [address] [place],  [place]  [address] [phone]  Clinic Day:  03/25/2023  Referring physician: [name], NP  HISTORY OF PRESENT ILLNESS:  The patient is a 42 y.o. female with recurrent iron deficiency anemia.  In the past, IV iron was effective in replenishing her iron stores and improving her hemoglobin.  She comes in today to reassess her iron and hemoglobin levels. Since her last visit, the patient has been doing okay.  She claims the only overt form of blood loss she has had are her menstrual cycles, which have not been particularly heavy.  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    VITALS:  There were no vitals taken for this visit.  Wt Readings from Last 3 Encounters:  09/25/22 253 lb 3.2 oz (114.9 kg)  03/24/22 264 lb 4.8 oz (119.9 kg)  12/01/21 260 lb (117.9 kg)    There is no height or weight on file to calculate BMI.  Performance status (ECOG): 1  PHYSICAL EXAM:  Physical Exam Vitals and nursing note reviewed.  Constitutional:      General: She is not in acute distress.    Appearance: Normal appearance.  HENT:     Head: Normocephalic and atraumatic.     Mouth/Throat:     Mouth: Mucous membranes are moist.     Pharynx: Oropharynx is clear. No oropharyngeal exudate or posterior oropharyngeal erythema.  Eyes:     General: No scleral icterus.    Extraocular Movements: Extraocular movements intact.     Conjunctiva/sclera: Conjunctivae normal.     Pupils: Pupils are equal, round, and reactive to light.  Cardiovascular:     Rate and Rhythm: Normal rate and regular rhythm.     Heart sounds: Normal heart sounds. No murmur heard.    No friction rub. No gallop.  Pulmonary:     Effort: Pulmonary effort is normal.     Breath sounds: Normal breath sounds. No wheezing, rhonchi or rales.  Abdominal:     General: There is no distension.     Palpations: Abdomen is  soft. There is no hepatomegaly, splenomegaly or mass.     Tenderness: There is no abdominal tenderness.  Musculoskeletal:     Right lower leg: No edema.     Left lower leg: No edema.  Lymphadenopathy:     Cervical: No cervical adenopathy.     Upper Body:     Right upper body: No supraclavicular or axillary adenopathy.     Left upper body: No supraclavicular or axillary adenopathy.     Lower Body: No right inguinal adenopathy. No left inguinal adenopathy.  Skin:    General: Skin is warm and dry.     Coloration: Skin is not jaundiced.     Findings: No rash.  Neurological:     Mental Status: She is alert and oriented to person, place, and time.     Cranial Nerves: No cranial nerve deficit.  Psychiatric:        Mood and Affect: Mood normal.        Behavior: Behavior normal.        Thought Content: Thought content normal.     LABS:    Latest Reference Range & Units 09/22/22 08:56  WBC 4.0 - 10.5 K/uL 7.8  RBC 3.87 - 5.11 MIL/uL 5.60 (H)  Hemoglobin 12.0 - 15.0 g/dL 93.2 (H)  HCT 35.5 - 73.2 %  49.4 (H)  MCV 80.0 - 100.0 fL 88.2  MCH 26.0 - 34.0 pg 27.7  MCHC 30.0 - 36.0 g/dL 16.1  RDW 09.6 - 04.5 % 17.0 (H)  Platelets 150 - 400 K/uL 414 (H)  nRBC 0.0 - 0.2 % 0.0  Neutrophils % 66  Lymphocytes % 24  Monocytes Relative % 7  Eosinophil % 1  Basophil % 1  Immature Granulocytes % 1  (H): Data is abnormally high  Latest Reference Range & Units 09/22/22 08:56  Iron 28 - 170 ug/dL 71  UIBC ug/dL 409  TIBC 811 - 914 ug/dL 782  Saturation Ratios 10.4 - 31.8 % 17  Ferritin 11 - 307 ng/mL 37   ASSESSMENT & PLAN:  Assessment/plan:  A 42 y.o. female with recurrent iron deficiency anemia.  I am very pleased her iron and hemoglobin levels have remained ideal over these past months.  Clinically, the patient appears to be doing well.  As that is the case, I will see her back in 6 months for repeat clinical assessment.  If her lab results remain ideal at that time, I will consider turning  her care back over to her other physicians after her next visit.  The patient understands all the plans discussed today and is in agreement with them.    Kirby Funk, MD

## 2023-03-26 ENCOUNTER — Telehealth: Payer: Self-pay | Admitting: Oncology

## 2023-03-26 ENCOUNTER — Inpatient Hospital Stay: Payer: No Typology Code available for payment source | Admitting: Oncology

## 2023-03-26 NOTE — Telephone Encounter (Signed)
03/26/2023  Patient missed her Appt this morning - Left Msg

## 2023-04-04 NOTE — Telephone Encounter (Signed)
Contacted pt to schedule an appt. Unable to reach via phone, voicemail was left.

## 2023-04-13 ENCOUNTER — Other Ambulatory Visit: Payer: Self-pay | Admitting: Oncology

## 2023-04-13 ENCOUNTER — Inpatient Hospital Stay: Payer: No Typology Code available for payment source | Attending: Oncology | Admitting: Oncology

## 2023-04-13 VITALS — BP 139/84 | HR 106 | Temp 99.5°F | Resp 16 | Ht 71.0 in | Wt 244.3 lb

## 2023-04-13 DIAGNOSIS — D508 Other iron deficiency anemias: Secondary | ICD-10-CM

## 2023-04-13 NOTE — Progress Notes (Signed)
[facility]  [address] [place],  [place]  [address] [phone]  Clinic Day:  04/13/2023  Referring physician: [name], NP  HISTORY OF PRESENT ILLNESS:  The patient is a 42 y.o. female with recurrent iron deficiency anemia.  In the past, IV iron was effective in replenishing her iron stores and improving her hemoglobin.  She comes in today to reassess her iron and hemoglobin levels. Since her last visit, the patient has been doing well.  She claims the only overt form of blood loss she has had are her menstrual cycles, which have not been particularly heavy.  She denies having other overt forms of blood loss.  VITALS:  Blood pressure 139/84, pulse (!) 106, temperature 99.5 F (37.5 C), resp. rate 16, height 5\' 11"  (1.803 m), weight 244 lb 4.8 oz (110.8 kg), SpO2 96%.  Wt Readings from Last 3 Encounters:  04/13/23 244 lb 4.8 oz (110.8 kg)  09/25/22 253 lb 3.2 oz (114.9 kg)  03/24/22 264 lb 4.8 oz (119.9 kg)    Body mass index is 34.07 kg/m.  Performance status (ECOG): 1  PHYSICAL EXAM:  Physical Exam Vitals and nursing note reviewed.  Constitutional:      General: She is not in acute distress.    Appearance: Normal appearance.  HENT:     Head: Normocephalic and atraumatic.     Mouth/Throat:     Mouth: Mucous membranes are moist.     Pharynx: Oropharynx is clear. No oropharyngeal exudate or posterior oropharyngeal erythema.  Eyes:     General: No scleral icterus.    Extraocular Movements: Extraocular movements intact.     Conjunctiva/sclera: Conjunctivae normal.     Pupils: Pupils are equal, round, and reactive to light.  Cardiovascular:     Rate and Rhythm: Normal rate and regular rhythm.     Heart sounds: Normal heart sounds. No murmur heard.    No friction rub. No gallop.  Pulmonary:     Effort: Pulmonary effort is normal.     Breath sounds: Normal breath sounds. No wheezing, rhonchi or rales.  Abdominal:     General:  There is no distension.     Palpations: Abdomen is soft. There is no hepatomegaly, splenomegaly or mass.     Tenderness: There is no abdominal tenderness.  Musculoskeletal:     Right lower leg: No edema.     Left lower leg: No edema.  Lymphadenopathy:     Cervical: No cervical adenopathy.     Upper Body:     Right upper body: No supraclavicular or axillary adenopathy.     Left upper body: No supraclavicular or axillary adenopathy.     Lower Body: No right inguinal adenopathy. No left inguinal adenopathy.  Skin:    General: Skin is warm and dry.     Coloration: Skin is not jaundiced.     Findings: No rash.  Neurological:     Mental Status: She is alert and oriented to person, place, and time.     Cranial Nerves: No cranial nerve deficit.  Psychiatric:        Mood and Affect: Mood normal.        Behavior: Behavior normal.        Thought Content: Thought content normal.     LABS:    Latest Reference Range & Units 03/23/23 14:19 03/23/23 14:20  Iron 28 - 170 ug/dL 46   UIBC ug/dL 528   TIBC 413 - 244 ug/dL 010  Saturation Ratios 10.4 - 31.8 % 12   Ferritin 11 - 307 ng/mL 11   WBC 4.0 - 10.5 K/uL  12.4 (H)  RBC 3.87 - 5.11 MIL/uL  5.30 (H)  Hemoglobin 12.0 - 15.0 g/dL  16.1  HCT 09.6 - 04.5 %  43.5  MCV 80.0 - 100.0 fL  82.1  MCH 26.0 - 34.0 pg  26.0  MCHC 30.0 - 36.0 g/dL  40.9  RDW 81.1 - 91.4 %  16.5 (H)  Platelets 150 - 400 K/uL  393  nRBC 0.0 - 0.2 %  0.0  Neutrophils %  72  Lymphocytes %  21  Monocytes Relative %  5  Eosinophil %  0  Basophil %  1  Immature Granulocytes %  1  (H): Data is abnormally high   ASSESSMENT & PLAN:  Assessment/plan:  A 42 y.o. female with recurrent iron deficiency anemia.  Although her hemoglobin is normal, her iron parameters are starting to slip again.  They are not low to where IV iron is necessary.  However, I am concerned another course of IV iron may be imminent.  Clinically, the patient is doing well.  I will see her back in 6  months for repeat clinical assessment.  The patient understands all the plans discussed today and is in agreement with them.    Kirby Funk, MD

## 2023-10-10 ENCOUNTER — Other Ambulatory Visit: Payer: Self-pay

## 2023-10-10 DIAGNOSIS — D509 Iron deficiency anemia, unspecified: Secondary | ICD-10-CM

## 2023-10-11 ENCOUNTER — Inpatient Hospital Stay: Payer: No Typology Code available for payment source | Attending: Oncology

## 2023-10-11 DIAGNOSIS — D509 Iron deficiency anemia, unspecified: Secondary | ICD-10-CM | POA: Diagnosis present

## 2023-10-11 LAB — CBC WITH DIFFERENTIAL (CANCER CENTER ONLY)
Abs Immature Granulocytes: 0.08 10*3/uL — ABNORMAL HIGH (ref 0.00–0.07)
Basophils Absolute: 0.1 10*3/uL (ref 0.0–0.1)
Basophils Relative: 1 %
Eosinophils Absolute: 0.1 10*3/uL (ref 0.0–0.5)
Eosinophils Relative: 1 %
HCT: 43.8 % (ref 36.0–46.0)
Hemoglobin: 14.1 g/dL (ref 12.0–15.0)
Immature Granulocytes: 1 %
Lymphocytes Relative: 30 %
Lymphs Abs: 2.7 10*3/uL (ref 0.7–4.0)
MCH: 24.4 pg — ABNORMAL LOW (ref 26.0–34.0)
MCHC: 32.2 g/dL (ref 30.0–36.0)
MCV: 75.6 fL — ABNORMAL LOW (ref 80.0–100.0)
Monocytes Absolute: 0.6 10*3/uL (ref 0.1–1.0)
Monocytes Relative: 6 %
Neutro Abs: 5.6 10*3/uL (ref 1.7–7.7)
Neutrophils Relative %: 61 %
Platelet Count: 486 10*3/uL — ABNORMAL HIGH (ref 150–400)
RBC: 5.79 MIL/uL — ABNORMAL HIGH (ref 3.87–5.11)
RDW: 17.6 % — ABNORMAL HIGH (ref 11.5–15.5)
WBC Count: 9.2 10*3/uL (ref 4.0–10.5)
nRBC: 0 % (ref 0.0–0.2)
nRBC: 0 /100{WBCs}

## 2023-10-11 LAB — FERRITIN: Ferritin: 7 ng/mL — ABNORMAL LOW (ref 11–307)

## 2023-10-11 LAB — IRON AND TIBC
Iron: 40 ug/dL (ref 28–170)
Saturation Ratios: 8 % — ABNORMAL LOW (ref 10.4–31.8)
TIBC: 483 ug/dL — ABNORMAL HIGH (ref 250–450)
UIBC: 443 ug/dL

## 2023-10-14 NOTE — Progress Notes (Signed)
 [facility]  [address] [place],  [place]  [address] [phone]  Clinic Day:  10/14/2023  Referring physician: Leonarda Rakers, NP  HISTORY OF PRESENT ILLNESS:  The patient is a 43 y.o. female with recurrent iron  deficiency anemia.  In the past, IV iron  was effective in replenishing her iron  stores and improving her hemoglobin.  She comes in today to reassess her iron  and hemoglobin levels. Since her last visit, the patient has been doing well.  She claims the only overt form of blood loss she has had are her menstrual cycles, which have not been particularly heavy.  She denies having other overt forms of blood loss.  VITALS:  There were no vitals taken for this visit.  Wt Readings from Last 3 Encounters:  04/13/23 244 lb 4.8 oz (110.8 kg)  09/25/22 253 lb 3.2 oz (114.9 kg)  03/24/22 264 lb 4.8 oz (119.9 kg)    There is no height or weight on file to calculate BMI.  Performance status (ECOG): 1  PHYSICAL EXAM:  Physical Exam Vitals and nursing note reviewed.  Constitutional:      General: She is not in acute distress.    Appearance: Normal appearance.  HENT:     Head: Normocephalic and atraumatic.     Mouth/Throat:     Mouth: Mucous membranes are moist.     Pharynx: Oropharynx is clear. No oropharyngeal exudate or posterior oropharyngeal erythema.  Eyes:     General: No scleral icterus.    Extraocular Movements: Extraocular movements intact.     Conjunctiva/sclera: Conjunctivae normal.     Pupils: Pupils are equal, round, and reactive to light.  Cardiovascular:     Rate and Rhythm: Normal rate and regular rhythm.     Heart sounds: Normal heart sounds. No murmur heard.    No friction rub. No gallop.  Pulmonary:     Effort: Pulmonary effort is normal.     Breath sounds: Normal breath sounds. No wheezing, rhonchi or rales.  Abdominal:     General: There is no distension.     Palpations: Abdomen is soft. There is no hepatomegaly,  splenomegaly or mass.     Tenderness: There is no abdominal tenderness.  Musculoskeletal:     Right lower leg: No edema.     Left lower leg: No edema.  Lymphadenopathy:     Cervical: No cervical adenopathy.     Upper Body:     Right upper body: No supraclavicular or axillary adenopathy.     Left upper body: No supraclavicular or axillary adenopathy.     Lower Body: No right inguinal adenopathy. No left inguinal adenopathy.  Skin:    General: Skin is warm and dry.     Coloration: Skin is not jaundiced.     Findings: No rash.  Neurological:     Mental Status: She is alert and oriented to person, place, and time.     Cranial Nerves: No cranial nerve deficit.  Psychiatric:        Mood and Affect: Mood normal.        Behavior: Behavior normal.        Thought Content: Thought content normal.     LABS:    Latest Reference Range & Units 03/23/23 14:19 03/23/23 14:20  Iron  28 - 170 ug/dL 46   UIBC ug/dL 366   TIBC 440 - 347 ug/dL 425   Saturation Ratios 10.4 - 31.8 % 12   Ferritin 11 - 307  ng/mL 11   WBC 4.0 - 10.5 K/uL  12.4 (H)  RBC 3.87 - 5.11 MIL/uL  5.30 (H)  Hemoglobin 12.0 - 15.0 g/dL  40.9  HCT 81.1 - 91.4 %  43.5  MCV 80.0 - 100.0 fL  82.1  MCH 26.0 - 34.0 pg  26.0  MCHC 30.0 - 36.0 g/dL  78.2  RDW 95.6 - 21.3 %  16.5 (H)  Platelets 150 - 400 K/uL  393  nRBC 0.0 - 0.2 %  0.0  Neutrophils %  72  Lymphocytes %  21  Monocytes Relative %  5  Eosinophil %  0  Basophil %  1  Immature Granulocytes %  1  (H): Data is abnormally high   ASSESSMENT & PLAN:  Assessment/plan:  A 42 y.o. female with recurrent iron  deficiency anemia.  Although her hemoglobin is normal, her iron  parameters are starting to slip again.  They are not low to where IV iron  is necessary.  However, I am concerned another course of IV iron  may be imminent.  Clinically, the patient is doing well.  I will see her back in 6 months for repeat clinical assessment.  The patient understands all the plans  discussed today and is in agreement with them.   Alonzo Loving Felicia Horde, MD

## 2023-10-15 ENCOUNTER — Inpatient Hospital Stay (HOSPITAL_BASED_OUTPATIENT_CLINIC_OR_DEPARTMENT_OTHER): Payer: No Typology Code available for payment source | Admitting: Oncology

## 2023-10-15 ENCOUNTER — Telehealth: Payer: Self-pay | Admitting: Oncology

## 2023-10-15 ENCOUNTER — Other Ambulatory Visit: Payer: Self-pay | Admitting: Oncology

## 2023-10-15 VITALS — BP 121/82 | HR 103 | Temp 98.7°F | Resp 16 | Ht 71.0 in | Wt 241.3 lb

## 2023-10-15 DIAGNOSIS — D508 Other iron deficiency anemias: Secondary | ICD-10-CM | POA: Diagnosis not present

## 2023-10-15 DIAGNOSIS — D509 Iron deficiency anemia, unspecified: Secondary | ICD-10-CM | POA: Diagnosis not present

## 2023-10-15 NOTE — Telephone Encounter (Signed)
 10/15/23 Next appt scheduled and confirmed with patient.

## 2023-10-15 NOTE — Progress Notes (Signed)
 [facility]  [address] [place],  [place]  [address] [phone]  Clinic Day:  10/15/2023  Referring physician: Leonarda Rakers, NP   HISTORY OF PRESENT ILLNESS:  The patient is a 43 y.o. female with recurrent iron  deficiency anemia.  In the past, IV iron  was effective in replenishing her iron  stores and improving her hemoglobin.  She comes in today to reassess her iron  and hemoglobin levels. Since her last visit, the patient has been doing well.  She claims the only overt form of blood loss she has had are her menstrual cycles, which have not been particularly heavy.  She denies having other overt forms of blood loss.  Of note, she did have both an EGD and colonoscopy, which came back negative for any adverse GI tract pathology.    PHYSICAL EXAM:  Blood pressure 121/82, pulse (!) 103, temperature 98.7 F (37.1 C), temperature source Oral, resp. rate 16, height 5\' 11"  (1.803 m), weight 241 lb 4.8 oz (109.5 kg), SpO2 97%. Wt Readings from Last 3 Encounters:  10/15/23 241 lb 4.8 oz (109.5 kg)  04/13/23 244 lb 4.8 oz (110.8 kg)  09/25/22 253 lb 3.2 oz (114.9 kg)   Body mass index is 33.65 kg/m. Performance status (ECOG): 0 - Asymptomatic Physical Exam Constitutional:      Appearance: Normal appearance. She is not ill-appearing.  HENT:     Mouth/Throat:     Mouth: Mucous membranes are moist.     Pharynx: Oropharynx is clear. No oropharyngeal exudate or posterior oropharyngeal erythema.  Cardiovascular:     Rate and Rhythm: Normal rate and regular rhythm.     Heart sounds: No murmur heard.    No friction rub. No gallop.  Pulmonary:     Effort: Pulmonary effort is normal. No respiratory distress.     Breath sounds: Normal breath sounds. No wheezing, rhonchi or rales.  Abdominal:     General: Bowel sounds are normal. There is no distension.     Palpations: Abdomen is soft. There is no mass.     Tenderness: There is no abdominal tenderness.   Musculoskeletal:        General: No swelling.     Right lower leg: No edema.     Left lower leg: No edema.  Lymphadenopathy:     Cervical: No cervical adenopathy.     Upper Body:     Right upper body: No supraclavicular or axillary adenopathy.     Left upper body: No supraclavicular or axillary adenopathy.     Lower Body: No right inguinal adenopathy. No left inguinal adenopathy.  Skin:    General: Skin is warm.     Coloration: Skin is not jaundiced.     Findings: No lesion or rash.  Neurological:     General: No focal deficit present.     Mental Status: She is alert and oriented to person, place, and time. Mental status is at baseline.  Psychiatric:        Mood and Affect: Mood normal.        Behavior: Behavior normal.        Thought Content: Thought content normal.     LABS:      Latest Ref Rng & Units 10/11/2023    9:31 AM 03/23/2023    2:20 PM 09/22/2022    8:56 AM  CBC  WBC 4.0 - 10.5 K/uL 9.2  12.4  7.8   Hemoglobin 12.0 - 15.0 g/dL 24.4  01.0  15.5   Hematocrit 36.0 - 46.0 % 43.8  43.5  49.4   Platelets 150 - 400 K/uL 486  393  414       Latest Ref Rng & Units 06/02/2019    2:41 PM  CMP  Glucose 70 - 99 mg/dL 409   BUN 6 - 20 mg/dL 11   Creatinine 8.11 - 1.00 mg/dL 9.14   Sodium 782 - 956 mmol/L 138   Potassium 3.5 - 5.1 mmol/L 3.6   Chloride 98 - 111 mmol/L 105   CO2 22 - 32 mmol/L 22   Calcium 8.9 - 10.3 mg/dL 8.7   Total Protein 6.5 - 8.1 g/dL 7.5   Total Bilirubin 0.3 - 1.2 mg/dL 0.3   Alkaline Phos 38 - 126 U/L 83   AST 15 - 41 U/L 22   ALT 0 - 44 U/L 32     Latest Reference Range & Units 03/23/23 14:19 10/11/23 09:31  Iron  28 - 170 ug/dL 46 40  UIBC ug/dL 213 086  TIBC 578 - 469 ug/dL 629 528 (H)  Saturation Ratios 10.4 - 31.8 % 12 8 (L)  Ferritin 11 - 307 ng/mL 11 7 (L)  (H): Data is abnormally high (L): Data is abnormally low  ASSESSMENT & PLAN:  Assessment/Plan:  A 43 y.o. female with iron  deficiency anemia.  Although her hemoglobin  remains normal, her iron  stores have fallen.  Based upon this, I will arrange for her to receive another course of IV iron  over the next few weeks.  Although not heavy, I do believe her menstrual cycles are the reason behind her recurrent iron  deficiency anemia.  I will see her back in 3 months to see how well she responded to her IV iron .  The patient understands all the plans discussed today and is in agreement with them.     Mcgregor Tinnon Felicia Horde, MD

## 2023-10-22 ENCOUNTER — Encounter: Payer: Self-pay | Admitting: Oncology

## 2023-10-23 ENCOUNTER — Inpatient Hospital Stay: Payer: No Typology Code available for payment source

## 2023-10-23 VITALS — BP 133/78 | HR 84 | Temp 98.3°F | Resp 16 | Ht 71.0 in | Wt 241.5 lb

## 2023-10-23 DIAGNOSIS — D509 Iron deficiency anemia, unspecified: Secondary | ICD-10-CM | POA: Diagnosis not present

## 2023-10-23 MED ORDER — IRON SUCROSE 20 MG/ML IV SOLN
200.0000 mg | Freq: Once | INTRAVENOUS | Status: AC
  Administered 2023-10-23: 200 mg via INTRAVENOUS
  Filled 2023-10-23: qty 10

## 2023-10-23 NOTE — Patient Instructions (Signed)

## 2023-10-25 ENCOUNTER — Inpatient Hospital Stay: Payer: No Typology Code available for payment source

## 2023-10-25 VITALS — BP 136/76 | HR 80 | Temp 98.2°F | Resp 16

## 2023-10-25 DIAGNOSIS — D509 Iron deficiency anemia, unspecified: Secondary | ICD-10-CM | POA: Diagnosis not present

## 2023-10-25 MED ORDER — IRON SUCROSE 20 MG/ML IV SOLN
200.0000 mg | Freq: Once | INTRAVENOUS | Status: AC
  Administered 2023-10-25: 200 mg via INTRAVENOUS
  Filled 2023-10-25: qty 10

## 2023-10-25 NOTE — Patient Instructions (Signed)

## 2023-10-29 ENCOUNTER — Inpatient Hospital Stay: Payer: No Typology Code available for payment source

## 2023-10-29 VITALS — BP 116/75 | HR 95 | Temp 98.4°F | Resp 18

## 2023-10-29 DIAGNOSIS — D509 Iron deficiency anemia, unspecified: Secondary | ICD-10-CM

## 2023-10-29 MED ORDER — IRON SUCROSE 20 MG/ML IV SOLN
200.0000 mg | Freq: Once | INTRAVENOUS | Status: AC
Start: 2023-10-29 — End: 2023-10-29
  Administered 2023-10-29: 200 mg via INTRAVENOUS
  Filled 2023-10-29: qty 10

## 2023-10-29 NOTE — Patient Instructions (Addendum)

## 2023-10-31 ENCOUNTER — Inpatient Hospital Stay: Payer: No Typology Code available for payment source

## 2023-10-31 VITALS — BP 134/76 | HR 82 | Temp 98.8°F | Resp 18

## 2023-10-31 DIAGNOSIS — D509 Iron deficiency anemia, unspecified: Secondary | ICD-10-CM | POA: Diagnosis not present

## 2023-10-31 MED ORDER — IRON SUCROSE 20 MG/ML IV SOLN
200.0000 mg | Freq: Once | INTRAVENOUS | Status: AC
  Administered 2023-10-31: 200 mg via INTRAVENOUS
  Filled 2023-10-31: qty 10

## 2023-10-31 MED ORDER — SODIUM CHLORIDE 0.9 % IV SOLN: INTRAVENOUS | Status: DC

## 2023-10-31 NOTE — Patient Instructions (Signed)

## 2023-11-02 ENCOUNTER — Inpatient Hospital Stay: Payer: No Typology Code available for payment source

## 2023-11-02 VITALS — BP 127/73 | HR 81 | Temp 98.4°F | Resp 18

## 2023-11-02 DIAGNOSIS — D509 Iron deficiency anemia, unspecified: Secondary | ICD-10-CM

## 2023-11-02 MED ORDER — IRON SUCROSE 20 MG/ML IV SOLN
200.0000 mg | Freq: Once | INTRAVENOUS | Status: AC
  Administered 2023-11-02: 200 mg via INTRAVENOUS
  Filled 2023-11-02: qty 10

## 2023-11-02 MED ORDER — SODIUM CHLORIDE 0.9 % IV SOLN: INTRAVENOUS | Status: DC

## 2024-01-10 NOTE — Progress Notes (Deleted)
 [facility]  [address] [place],  [place]  [address] [phone]  Clinic Day:  01/10/2024  Referring physician: Leonarda Rakers, NP   HISTORY OF PRESENT ILLNESS:  The patient is a 43 y.o. female with recurrent iron  deficiency anemia.   She comes in today to reassess her iron  and hemoglobin levels after receiving IV iron  in February 2025.   In the past, IV iron  was effective in replenishing her iron  stores and improving her hemoglobin.  She comes in today to reassess her iron  and hemoglobin levels. Since her last visit, the patient has been doing well.  She claims the only overt form of blood loss she has had are her menstrual cycles, which have not been particularly heavy.  She denies having other overt forms of blood loss.  Of note, she did have both an EGD and colonoscopy, which came back negative for any adverse GI tract pathology.    PHYSICAL EXAM:  There were no vitals taken for this visit. Wt Readings from Last 3 Encounters:  10/23/23 241 lb 8 oz (109.5 kg)  10/15/23 241 lb 4.8 oz (109.5 kg)  04/13/23 244 lb 4.8 oz (110.8 kg)   There is no height or weight on file to calculate BMI. Performance status (ECOG): 0 - Asymptomatic Physical Exam Constitutional:      Appearance: Normal appearance. She is not ill-appearing.  HENT:     Mouth/Throat:     Mouth: Mucous membranes are moist.     Pharynx: Oropharynx is clear. No oropharyngeal exudate or posterior oropharyngeal erythema.  Cardiovascular:     Rate and Rhythm: Normal rate and regular rhythm.     Heart sounds: No murmur heard.    No friction rub. No gallop.  Pulmonary:     Effort: Pulmonary effort is normal. No respiratory distress.     Breath sounds: Normal breath sounds. No wheezing, rhonchi or rales.  Abdominal:     General: Bowel sounds are normal. There is no distension.     Palpations: Abdomen is soft. There is no mass.     Tenderness: There is no abdominal tenderness.   Musculoskeletal:        General: No swelling.     Right lower leg: No edema.     Left lower leg: No edema.  Lymphadenopathy:     Cervical: No cervical adenopathy.     Upper Body:     Right upper body: No supraclavicular or axillary adenopathy.     Left upper body: No supraclavicular or axillary adenopathy.     Lower Body: No right inguinal adenopathy. No left inguinal adenopathy.  Skin:    General: Skin is warm.     Coloration: Skin is not jaundiced.     Findings: No lesion or rash.  Neurological:     General: No focal deficit present.     Mental Status: She is alert and oriented to person, place, and time. Mental status is at baseline.  Psychiatric:        Mood and Affect: Mood normal.        Behavior: Behavior normal.        Thought Content: Thought content normal.     LABS:      Latest Ref Rng & Units 10/11/2023    9:31 AM 03/23/2023    2:20 PM 09/22/2022    8:56 AM  CBC  WBC 4.0 - 10.5 K/uL 9.2  12.4  7.8   Hemoglobin 12.0 - 15.0 g/dL 14.1  13.8  15.5   Hematocrit 36.0 - 46.0 % 43.8  43.5  49.4   Platelets 150 - 400 K/uL 486  393  414       Latest Ref Rng & Units 06/02/2019    2:41 PM  CMP  Glucose 70 - 99 mg/dL 161   BUN 6 - 20 mg/dL 11   Creatinine 0.96 - 1.00 mg/dL 0.45   Sodium 409 - 811 mmol/L 138   Potassium 3.5 - 5.1 mmol/L 3.6   Chloride 98 - 111 mmol/L 105   CO2 22 - 32 mmol/L 22   Calcium 8.9 - 10.3 mg/dL 8.7   Total Protein 6.5 - 8.1 g/dL 7.5   Total Bilirubin 0.3 - 1.2 mg/dL 0.3   Alkaline Phos 38 - 126 U/L 83   AST 15 - 41 U/L 22   ALT 0 - 44 U/L 32     Latest Reference Range & Units 03/23/23 14:19 10/11/23 09:31  Iron  28 - 170 ug/dL 46 40  UIBC ug/dL 914 782  TIBC 956 - 213 ug/dL 086 578 (H)  Saturation Ratios 10.4 - 31.8 % 12 8 (L)  Ferritin 11 - 307 ng/mL 11 7 (L)  (H): Data is abnormally high (L): Data is abnormally low  ASSESSMENT & PLAN:  Assessment/Plan:  A 43 y.o. female with iron  deficiency anemia.  Although her hemoglobin  remains normal, her iron  stores have fallen.  Based upon this, I will arrange for her to receive another course of IV iron  over the next few weeks.  Although not heavy, I do believe her menstrual cycles are the reason behind her recurrent iron  deficiency anemia.  I will see her back in 3 months to see how well she responded to her IV iron .  The patient understands all the plans discussed today and is in agreement with them.     Jonquil Stubbe Felicia Horde, MD

## 2024-01-11 ENCOUNTER — Inpatient Hospital Stay: Payer: No Typology Code available for payment source | Admitting: Oncology

## 2024-01-11 ENCOUNTER — Inpatient Hospital Stay: Payer: No Typology Code available for payment source | Attending: Oncology

## 2024-01-11 ENCOUNTER — Other Ambulatory Visit: Payer: Self-pay

## 2024-01-11 DIAGNOSIS — D509 Iron deficiency anemia, unspecified: Secondary | ICD-10-CM

## 2024-01-11 LAB — CBC WITH DIFFERENTIAL (CANCER CENTER ONLY)
Abs Immature Granulocytes: 0.09 10*3/uL — ABNORMAL HIGH (ref 0.00–0.07)
Basophils Absolute: 0.1 10*3/uL (ref 0.0–0.1)
Basophils Relative: 1 %
Eosinophils Absolute: 0.1 10*3/uL (ref 0.0–0.5)
Eosinophils Relative: 1 %
HCT: 49 % — ABNORMAL HIGH (ref 36.0–46.0)
Hemoglobin: 16.1 g/dL — ABNORMAL HIGH (ref 12.0–15.0)
Immature Granulocytes: 1 %
Lymphocytes Relative: 25 %
Lymphs Abs: 2.8 10*3/uL (ref 0.7–4.0)
MCH: 27.5 pg (ref 26.0–34.0)
MCHC: 32.9 g/dL (ref 30.0–36.0)
MCV: 83.6 fL (ref 80.0–100.0)
Monocytes Absolute: 0.6 10*3/uL (ref 0.1–1.0)
Monocytes Relative: 5 %
Neutro Abs: 7.6 10*3/uL (ref 1.7–7.7)
Neutrophils Relative %: 67 %
Platelet Count: 378 10*3/uL (ref 150–400)
RBC: 5.86 MIL/uL — ABNORMAL HIGH (ref 3.87–5.11)
RDW: 16.5 % — ABNORMAL HIGH (ref 11.5–15.5)
WBC Count: 11.3 10*3/uL — ABNORMAL HIGH (ref 4.0–10.5)
nRBC: 0 % (ref 0.0–0.2)

## 2024-01-11 LAB — IRON AND TIBC
Iron: 54 ug/dL (ref 28–170)
Saturation Ratios: 13 % (ref 10.4–31.8)
TIBC: 426 ug/dL (ref 250–450)
UIBC: 372 ug/dL

## 2024-01-11 LAB — FERRITIN: Ferritin: 39 ng/mL (ref 11–307)

## 2024-01-15 ENCOUNTER — Other Ambulatory Visit: Payer: Self-pay | Admitting: Oncology

## 2024-01-15 ENCOUNTER — Inpatient Hospital Stay (HOSPITAL_BASED_OUTPATIENT_CLINIC_OR_DEPARTMENT_OTHER): Admitting: Oncology

## 2024-01-15 VITALS — BP 159/96 | HR 94 | Temp 98.8°F | Resp 16 | Ht 71.0 in | Wt 240.0 lb

## 2024-01-15 DIAGNOSIS — D500 Iron deficiency anemia secondary to blood loss (chronic): Secondary | ICD-10-CM

## 2024-01-15 DIAGNOSIS — D509 Iron deficiency anemia, unspecified: Secondary | ICD-10-CM | POA: Diagnosis not present

## 2024-01-15 NOTE — Progress Notes (Unsigned)
 ` [facility]  [address] [place],  [place]  [address] [phone]  Clinic Day:  01/15/2024  Referring physician: No ref. provider found   HISTORY OF PRESENT ILLNESS:  The patient is a 43 y.o. female   PHYSICAL EXAM:  There were no vitals taken for this visit. Wt Readings from Last 3 Encounters:  01/15/24 240 lb (108.9 kg)  10/23/23 241 lb 8 oz (109.5 kg)  10/15/23 241 lb 4.8 oz (109.5 kg)   There is no height or weight on file to calculate BMI. Performance status (ECOG): 0 - Asymptomatic Physical Exam Constitutional:      Appearance: Normal appearance. She is not ill-appearing.  HENT:     Mouth/Throat:     Mouth: Mucous membranes are moist.     Pharynx: Oropharynx is clear. No oropharyngeal exudate or posterior oropharyngeal erythema.  Cardiovascular:     Rate and Rhythm: Normal rate and regular rhythm.     Heart sounds: No murmur heard.    No friction rub. No gallop.  Pulmonary:     Effort: Pulmonary effort is normal. No respiratory distress.     Breath sounds: Normal breath sounds. No wheezing, rhonchi or rales.  Abdominal:     General: Bowel sounds are normal. There is no distension.     Palpations: Abdomen is soft. There is no mass.     Tenderness: There is no abdominal tenderness.  Musculoskeletal:        General: No swelling.     Right lower leg: No edema.     Left lower leg: No edema.  Lymphadenopathy:     Cervical: No cervical adenopathy.     Upper Body:     Right upper body: No supraclavicular or axillary adenopathy.     Left upper body: No supraclavicular or axillary adenopathy.     Lower Body: No right inguinal adenopathy. No left inguinal adenopathy.  Skin:    General: Skin is warm.     Coloration: Skin is not jaundiced.     Findings: No lesion or rash.  Neurological:     General: No focal deficit present.     Mental Status: She is alert and oriented to person, place, and time. Mental status is at baseline.   Psychiatric:        Mood and Affect: Mood normal.        Behavior: Behavior normal.        Thought Content: Thought content normal.     LABS:      Latest Ref Rng & Units 01/11/2024    9:21 AM 10/11/2023    9:31 AM 03/23/2023    2:20 PM  CBC  WBC 4.0 - 10.5 K/uL 11.3  9.2  12.4   Hemoglobin 12.0 - 15.0 g/dL 91.4  78.2  95.6   Hematocrit 36.0 - 46.0 % 49.0  43.8  43.5   Platelets 150 - 400 K/uL 378  486  393       Latest Ref Rng & Units 06/02/2019    2:41 PM  CMP  Glucose 70 - 99 mg/dL 213   BUN 6 - 20 mg/dL 11   Creatinine 0.86 - 1.00 mg/dL 5.78   Sodium 469 - 629 mmol/L 138   Potassium 3.5 - 5.1 mmol/L 3.6   Chloride 98 - 111 mmol/L 105   CO2 22 - 32 mmol/L 22   Calcium 8.9 - 10.3 mg/dL 8.7   Total Protein 6.5 - 8.1 g/dL 7.5  Total Bilirubin 0.3 - 1.2 mg/dL 0.3   Alkaline Phos 38 - 126 U/L 83   AST 15 - 41 U/L 22   ALT 0 - 44 U/L 32     Latest Reference Range & Units 10/11/23 09:31 01/11/24 09:21  Iron  28 - 170 ug/dL 40 54  UIBC ug/dL 578 469  TIBC 629 - 528 ug/dL 413 (H) 244  Saturation Ratios 10.4 - 31.8 % 8 (L) 13  Ferritin 11 - 307 ng/mL 7 (L) 39  (H): Data is abnormally high (L): Data is abnormally low  ASSESSMENT & PLAN:  Assessment/Plan:  A 43 y.o. female with iron  deficiency anemia.  I am pleased with the improvement in both her iron  and hemoglobin levels since receiving IV iron  in February 2025.  Clinically, she appears to be doing well.  She is scheduled to see a gynecologist in the forthcoming weeks to discuss if a hysterectomy may need to be considered to address her heavy menstrual cycles.  Otherwise, as he okay I also help she is clinically doing well, I will see her back in 6 months to reassess her iron  and hemoglobin levels.  The patient understands all the plans discussed today and is in agreement with them.     Somnang Mahan Felicia Horde, MD

## 2024-01-15 NOTE — Progress Notes (Signed)
 ` [facility]  [address] [place],  [place]  [address] [phone]  Clinic Day:  01/15/2024  Referring physician: Leonarda Rakers, NP   HISTORY OF PRESENT ILLNESS:  The patient is a 43 y.o. female with recurrent iron  deficiency anemia.   She comes in today to reassess her iron  and hemoglobin levels after receiving IV iron  in February 2025.  The patient has felt better since her IV iron  was given.  She no longer has the hand stiffness, leg muscle cramps, or possible cravings she usually has when she is iron  deficient.  Of note, she claims her menstrual cycle still last 5-7 days, but are only heavy on 1 day.  She denies having other overt forms of blood loss.  PHYSICAL EXAM:  Blood pressure (!) 159/96, pulse 94, temperature 98.8 F (37.1 C), temperature source Oral, resp. rate 16, height 5\' 11"  (1.803 m), weight 240 lb (108.9 kg), SpO2 99%. Wt Readings from Last 3 Encounters:  01/15/24 240 lb (108.9 kg)  10/23/23 241 lb 8 oz (109.5 kg)  10/15/23 241 lb 4.8 oz (109.5 kg)   Body mass index is 33.47 kg/m. Performance status (ECOG): 0 - Asymptomatic Physical Exam Constitutional:      Appearance: Normal appearance. She is not ill-appearing.  HENT:     Mouth/Throat:     Mouth: Mucous membranes are moist.     Pharynx: Oropharynx is clear. No oropharyngeal exudate or posterior oropharyngeal erythema.  Cardiovascular:     Rate and Rhythm: Normal rate and regular rhythm.     Heart sounds: No murmur heard.    No friction rub. No gallop.  Pulmonary:     Effort: Pulmonary effort is normal. No respiratory distress.     Breath sounds: Normal breath sounds. No wheezing, rhonchi or rales.  Abdominal:     General: Bowel sounds are normal. There is no distension.     Palpations: Abdomen is soft. There is no mass.     Tenderness: There is no abdominal tenderness.  Musculoskeletal:        General: No swelling.     Right lower leg: No edema.     Left lower  leg: No edema.  Lymphadenopathy:     Cervical: No cervical adenopathy.     Upper Body:     Right upper body: No supraclavicular or axillary adenopathy.     Left upper body: No supraclavicular or axillary adenopathy.     Lower Body: No right inguinal adenopathy. No left inguinal adenopathy.  Skin:    General: Skin is warm.     Coloration: Skin is not jaundiced.     Findings: No lesion or rash.  Neurological:     General: No focal deficit present.     Mental Status: She is alert and oriented to person, place, and time. Mental status is at baseline.  Psychiatric:        Mood and Affect: Mood normal.        Behavior: Behavior normal.        Thought Content: Thought content normal.    LABS:      Latest Ref Rng & Units 01/11/2024    9:21 AM 10/11/2023    9:31 AM 03/23/2023    2:20 PM  CBC  WBC 4.0 - 10.5 K/uL 11.3  9.2  12.4   Hemoglobin 12.0 - 15.0 g/dL 91.4  78.2  95.6   Hematocrit 36.0 - 46.0 % 49.0  43.8  43.5   Platelets  150 - 400 K/uL 378  486  393       Latest Ref Rng & Units 06/02/2019    2:41 PM  CMP  Glucose 70 - 99 mg/dL 161   BUN 6 - 20 mg/dL 11   Creatinine 0.96 - 1.00 mg/dL 0.45   Sodium 409 - 811 mmol/L 138   Potassium 3.5 - 5.1 mmol/L 3.6   Chloride 98 - 111 mmol/L 105   CO2 22 - 32 mmol/L 22   Calcium 8.9 - 10.3 mg/dL 8.7   Total Protein 6.5 - 8.1 g/dL 7.5   Total Bilirubin 0.3 - 1.2 mg/dL 0.3   Alkaline Phos 38 - 126 U/L 83   AST 15 - 41 U/L 22   ALT 0 - 44 U/L 32     Latest Reference Range & Units 10/11/23 09:31 01/11/24 09:21  Iron  28 - 170 ug/dL 40 54  UIBC ug/dL 914 782  TIBC 956 - 213 ug/dL 086 (H) 578  Saturation Ratios 10.4 - 31.8 % 8 (L) 13  Ferritin 11 - 307 ng/mL 7 (L) 39  (H): Data is abnormally high (L): Data is abnormally low  ASSESSMENT & PLAN:  Assessment/Plan:  A 43 y.o. female with iron  deficiency anemia.  I am pleased with the improvement in both her iron  and hemoglobin levels since receiving IV iron  in February 2025.  Clinically,  she appears to be doing well.  She is scheduled to see a gynecologist in the forthcoming weeks to discuss if a hysterectomy may need to be considered to address her heavy menstrual cycles.  Otherwise, as she is clinically doing well, I will see her back in 6 months to reassess her iron  and hemoglobin levels.  The patient understands all the plans discussed today and is in agreement with them.    Torah Pinnock Felicia Horde, MD

## 2024-07-04 DIAGNOSIS — E782 Mixed hyperlipidemia: Secondary | ICD-10-CM | POA: Diagnosis not present

## 2024-07-04 DIAGNOSIS — F3341 Major depressive disorder, recurrent, in partial remission: Secondary | ICD-10-CM | POA: Diagnosis not present

## 2024-07-04 DIAGNOSIS — E1169 Type 2 diabetes mellitus with other specified complication: Secondary | ICD-10-CM | POA: Diagnosis not present

## 2024-07-04 DIAGNOSIS — K219 Gastro-esophageal reflux disease without esophagitis: Secondary | ICD-10-CM | POA: Diagnosis not present

## 2024-07-04 DIAGNOSIS — Z23 Encounter for immunization: Secondary | ICD-10-CM | POA: Diagnosis not present

## 2024-07-04 DIAGNOSIS — I10 Essential (primary) hypertension: Secondary | ICD-10-CM | POA: Diagnosis not present

## 2024-07-08 ENCOUNTER — Encounter: Payer: Self-pay | Admitting: Oncology

## 2024-07-15 ENCOUNTER — Inpatient Hospital Stay: Payer: Self-pay | Attending: Oncology

## 2024-07-15 ENCOUNTER — Encounter: Payer: Self-pay | Admitting: Oncology

## 2024-07-15 DIAGNOSIS — D509 Iron deficiency anemia, unspecified: Secondary | ICD-10-CM | POA: Diagnosis not present

## 2024-07-15 LAB — CBC WITH DIFFERENTIAL (CANCER CENTER ONLY)
Abs Immature Granulocytes: 0.07 K/uL (ref 0.00–0.07)
Basophils Absolute: 0.1 K/uL (ref 0.0–0.1)
Basophils Relative: 1 %
Eosinophils Absolute: 0.1 K/uL (ref 0.0–0.5)
Eosinophils Relative: 1 %
HCT: 50 % — ABNORMAL HIGH (ref 36.0–46.0)
Hemoglobin: 16.5 g/dL — ABNORMAL HIGH (ref 12.0–15.0)
Immature Granulocytes: 1 %
Lymphocytes Relative: 19 %
Lymphs Abs: 2.3 K/uL (ref 0.7–4.0)
MCH: 27.6 pg (ref 26.0–34.0)
MCHC: 33 g/dL (ref 30.0–36.0)
MCV: 83.6 fL (ref 80.0–100.0)
Monocytes Absolute: 0.7 K/uL (ref 0.1–1.0)
Monocytes Relative: 6 %
Neutro Abs: 8.8 K/uL — ABNORMAL HIGH (ref 1.7–7.7)
Neutrophils Relative %: 72 %
Platelet Count: 469 K/uL — ABNORMAL HIGH (ref 150–400)
RBC: 5.98 MIL/uL — ABNORMAL HIGH (ref 3.87–5.11)
RDW: 15.1 % (ref 11.5–15.5)
WBC Count: 12 K/uL — ABNORMAL HIGH (ref 4.0–10.5)
nRBC: 0 % (ref 0.0–0.2)

## 2024-07-15 LAB — IRON AND TIBC
Iron: 42 ug/dL (ref 28–170)
Saturation Ratios: 8 % — ABNORMAL LOW (ref 10.4–31.8)
TIBC: 494 ug/dL — ABNORMAL HIGH (ref 250–450)
UIBC: 453 ug/dL

## 2024-07-15 LAB — FERRITIN: Ferritin: 44 ng/mL (ref 11–307)

## 2024-07-16 ENCOUNTER — Other Ambulatory Visit: Payer: Self-pay | Admitting: Hematology and Oncology

## 2024-07-16 DIAGNOSIS — D509 Iron deficiency anemia, unspecified: Secondary | ICD-10-CM

## 2024-07-17 ENCOUNTER — Inpatient Hospital Stay (HOSPITAL_BASED_OUTPATIENT_CLINIC_OR_DEPARTMENT_OTHER): Payer: Self-pay | Admitting: Hematology and Oncology

## 2024-07-17 ENCOUNTER — Telehealth: Payer: Self-pay | Admitting: Hematology and Oncology

## 2024-07-17 DIAGNOSIS — D509 Iron deficiency anemia, unspecified: Secondary | ICD-10-CM

## 2024-07-17 NOTE — Progress Notes (Signed)
 ` [facility]  [address] [place],  [place]  [address] [phone]  Clinic Day:  07/17/2024  Referring physician: Toribio Arabia, NP   HISTORY OF PRESENT ILLNESS:  The patient is a 43 y.o. female with recurrent iron  deficiency anemia.   She comes in today to reassess her iron  and hemoglobin levels after receiving IV iron  in February 2025.  The patient has felt better since her IV iron  was given.  She no longer has the hand stiffness, leg muscle cramps, or possible cravings she usually has when she is iron  deficient.  Of note, she claims her menstrual cycle still last 5-7 days, but are only heavy on 1 day.  She denies having other overt forms of blood loss.  PHYSICAL EXAM:  There were no vitals taken for this visit. Wt Readings from Last 3 Encounters:  01/15/24 240 lb (108.9 kg)  10/23/23 241 lb 8 oz (109.5 kg)  10/15/23 241 lb 4.8 oz (109.5 kg)   There is no height or weight on file to calculate BMI. Performance status (ECOG): 0 - Asymptomatic Physical Exam Constitutional:      Appearance: Normal appearance. She is not ill-appearing.  HENT:     Mouth/Throat:     Mouth: Mucous membranes are moist.     Pharynx: Oropharynx is clear. No oropharyngeal exudate or posterior oropharyngeal erythema.  Cardiovascular:     Rate and Rhythm: Normal rate and regular rhythm.     Heart sounds: No murmur heard.    No friction rub. No gallop.  Pulmonary:     Effort: Pulmonary effort is normal. No respiratory distress.     Breath sounds: Normal breath sounds. No wheezing, rhonchi or rales.  Abdominal:     General: Bowel sounds are normal. There is no distension.     Palpations: Abdomen is soft. There is no mass.     Tenderness: There is no abdominal tenderness.  Musculoskeletal:        General: No swelling.     Right lower leg: No edema.     Left lower leg: No edema.  Lymphadenopathy:     Cervical: No cervical adenopathy.     Upper Body:     Right  upper body: No supraclavicular or axillary adenopathy.     Left upper body: No supraclavicular or axillary adenopathy.     Lower Body: No right inguinal adenopathy. No left inguinal adenopathy.  Skin:    General: Skin is warm.     Coloration: Skin is not jaundiced.     Findings: No lesion or rash.  Neurological:     General: No focal deficit present.     Mental Status: She is alert and oriented to person, place, and time. Mental status is at baseline.  Psychiatric:        Mood and Affect: Mood normal.        Behavior: Behavior normal.        Thought Content: Thought content normal.    LABS:      Latest Ref Rng & Units 07/15/2024    8:58 AM 01/11/2024    9:21 AM 10/11/2023    9:31 AM  CBC  WBC 4.0 - 10.5 K/uL 12.0  11.3  9.2   Hemoglobin 12.0 - 15.0 g/dL 83.4  83.8  85.8   Hematocrit 36.0 - 46.0 % 50.0  49.0  43.8   Platelets 150 - 400 K/uL 469  378  486       Latest  Ref Rng & Units 06/02/2019    2:41 PM  CMP  Glucose 70 - 99 mg/dL 840   BUN 6 - 20 mg/dL 11   Creatinine 9.55 - 1.00 mg/dL 9.16   Sodium 864 - 854 mmol/L 138   Potassium 3.5 - 5.1 mmol/L 3.6   Chloride 98 - 111 mmol/L 105   CO2 22 - 32 mmol/L 22   Calcium 8.9 - 10.3 mg/dL 8.7   Total Protein 6.5 - 8.1 g/dL 7.5   Total Bilirubin 0.3 - 1.2 mg/dL 0.3   Alkaline Phos 38 - 126 U/L 83   AST 15 - 41 U/L 22   ALT 0 - 44 U/L 32     Latest Reference Range & Units 10/11/23 09:31 01/11/24 09:21  Iron  28 - 170 ug/dL 40 54  UIBC ug/dL 556 627  TIBC 749 - 549 ug/dL 516 (H) 573  Saturation Ratios 10.4 - 31.8 % 8 (L) 13  Ferritin 11 - 307 ng/mL 7 (L) 39  (H): Data is abnormally high (L): Data is abnormally low  ASSESSMENT & PLAN:  Assessment/Plan:  A 43 y.o. female with iron  deficiency anemia.  I am pleased with the improvement in both her iron  and hemoglobin levels since receiving IV iron  in February 2025.  Clinically, she appears to be doing well.  She is scheduled to see a gynecologist in the forthcoming weeks to  discuss if a hysterectomy may need to be considered to address her heavy menstrual cycles.  Otherwise, as she is clinically doing well, I will see her back in 6 months to reassess her iron  and hemoglobin levels.  The patient understands all the plans discussed today and is in agreement with them.    Eleanor DELENA Bach, NP

## 2024-07-17 NOTE — Telephone Encounter (Signed)
 Patient has been scheduled for follow-up visit per 07/17/24 LOS.  Pt aware of scheduled appt details.

## 2024-07-18 ENCOUNTER — Encounter: Payer: Self-pay | Admitting: Oncology

## 2024-07-21 ENCOUNTER — Inpatient Hospital Stay

## 2024-07-21 VITALS — BP 136/81 | HR 99 | Temp 97.9°F | Resp 20

## 2024-07-21 DIAGNOSIS — D509 Iron deficiency anemia, unspecified: Secondary | ICD-10-CM | POA: Diagnosis not present

## 2024-07-21 MED ORDER — IRON SUCROSE 20 MG/ML IV SOLN
200.0000 mg | Freq: Once | INTRAVENOUS | Status: AC
  Administered 2024-07-21: 200 mg via INTRAVENOUS
  Filled 2024-07-21: qty 10

## 2024-07-21 NOTE — Patient Instructions (Signed)

## 2024-07-23 ENCOUNTER — Inpatient Hospital Stay

## 2024-07-23 VITALS — BP 112/78 | HR 78 | Temp 98.4°F | Resp 20

## 2024-07-23 DIAGNOSIS — D509 Iron deficiency anemia, unspecified: Secondary | ICD-10-CM

## 2024-07-23 MED ORDER — IRON SUCROSE 20 MG/ML IV SOLN
200.0000 mg | Freq: Once | INTRAVENOUS | Status: AC
  Administered 2024-07-23: 200 mg via INTRAVENOUS
  Filled 2024-07-23: qty 10

## 2024-07-23 MED ORDER — SODIUM CHLORIDE 0.9 % IV SOLN: INTRAVENOUS | Status: DC

## 2024-07-23 NOTE — Patient Instructions (Signed)

## 2024-07-25 ENCOUNTER — Inpatient Hospital Stay

## 2024-07-25 VITALS — BP 127/87 | HR 98 | Temp 98.9°F | Resp 18

## 2024-07-25 DIAGNOSIS — D509 Iron deficiency anemia, unspecified: Secondary | ICD-10-CM

## 2024-07-25 MED ORDER — IRON SUCROSE 20 MG/ML IV SOLN
200.0000 mg | Freq: Once | INTRAVENOUS | Status: AC
  Administered 2024-07-25: 200 mg via INTRAVENOUS
  Filled 2024-07-25: qty 10

## 2024-07-25 NOTE — Patient Instructions (Signed)

## 2024-07-28 ENCOUNTER — Inpatient Hospital Stay

## 2024-07-28 VITALS — BP 112/78 | HR 90 | Temp 98.7°F | Resp 18

## 2024-07-28 DIAGNOSIS — D509 Iron deficiency anemia, unspecified: Secondary | ICD-10-CM | POA: Diagnosis not present

## 2024-07-28 MED ORDER — IRON SUCROSE 20 MG/ML IV SOLN
200.0000 mg | Freq: Once | INTRAVENOUS | Status: AC
  Administered 2024-07-28: 200 mg via INTRAVENOUS
  Filled 2024-07-28: qty 10

## 2024-07-28 MED ORDER — SODIUM CHLORIDE 0.9 % IV SOLN: INTRAVENOUS | Status: DC

## 2024-07-28 NOTE — Patient Instructions (Signed)

## 2024-07-29 ENCOUNTER — Inpatient Hospital Stay

## 2024-07-29 VITALS — BP 109/78 | HR 82 | Temp 98.3°F | Resp 16

## 2024-07-29 DIAGNOSIS — D509 Iron deficiency anemia, unspecified: Secondary | ICD-10-CM | POA: Diagnosis not present

## 2024-07-29 MED ORDER — IRON SUCROSE 20 MG/ML IV SOLN
200.0000 mg | Freq: Once | INTRAVENOUS | Status: AC
  Administered 2024-07-29: 200 mg via INTRAVENOUS
  Filled 2024-07-29: qty 10

## 2024-07-29 NOTE — Patient Instructions (Signed)

## 2024-08-07 ENCOUNTER — Encounter: Payer: Self-pay | Admitting: Oncology

## 2024-08-25 ENCOUNTER — Other Ambulatory Visit: Payer: Self-pay | Admitting: Hematology and Oncology

## 2024-08-25 DIAGNOSIS — D509 Iron deficiency anemia, unspecified: Secondary | ICD-10-CM

## 2024-08-26 ENCOUNTER — Inpatient Hospital Stay: Attending: Oncology | Admitting: Hematology and Oncology

## 2024-08-26 ENCOUNTER — Inpatient Hospital Stay
